# Patient Record
Sex: Male | Born: 1945 | Race: White | Hispanic: No | Marital: Married | Smoking: Former smoker
Health system: Southern US, Community
[De-identification: ages and names within clinical notes are randomized; demographics above are authoritative.]

## PROBLEM LIST (undated history)

## (undated) DIAGNOSIS — Q059 Spina bifida, unspecified: Secondary | ICD-10-CM

## (undated) DIAGNOSIS — E119 Type 2 diabetes mellitus without complications: Secondary | ICD-10-CM

## (undated) DIAGNOSIS — E785 Hyperlipidemia, unspecified: Secondary | ICD-10-CM

## (undated) DIAGNOSIS — Z789 Other specified health status: Secondary | ICD-10-CM

## (undated) HISTORY — DX: Other specified health status: Z78.9

## (undated) HISTORY — DX: Hyperlipidemia, unspecified: E78.5

## (undated) HISTORY — DX: Type 2 diabetes mellitus without complications: E11.9

## (undated) HISTORY — PX: KNEE ARTHROSCOPY: SUR90

## (undated) HISTORY — DX: Spina bifida, unspecified: Q05.9

---

## 2007-09-02 ENCOUNTER — Ambulatory Visit: Payer: Self-pay | Admitting: Surgery

## 2010-12-20 NOTE — Assessment & Plan Note (Signed)
OFFICE VISIT   Grant Richardson, Grant Richardson  DOB:  Jun 18, 1946                                       09/02/2007  EAVWU#:[id number]   PRIMARY ENDOCRINOLOGIST:  Dr. Dalene Seltzer at [facility].   REASON FOR VISIT:  Evaluate left toes.   HISTORY:  This is a 65 year old gentleman who presents with  approximately 3 week history of a bluish-red color change on his third  and fourth left toes.  The patient does have a history of cold exposure  at work, as well as wearing steel-toed shoes.  Of note, he is also a  diabetic.  However, his blood sugars have been well controlled.  He has  recently been started on Plavix for concern over pre-ulcerative lesions  on his skin.  He comes in today for further evaluation.  The patient  states that his left leg chronically is colder than his right.  It is  also slightly smaller.  He has never had episodes like this before.  He  denies having trouble with feeling in his feet, with the exception of  the lateral aspect of his left little toe, and he has not had any motor  function problems.  There has been no open wound.   REVIEW OF SYSTEMS:  GENERAL:  NO fevers or chills.  CARDIAC:  Negative.  PULMONARY:  Negative.  GASTROINTESTINAL:  Negative.  GENITOURINARY:  Negative.  VASCULAR:  Negative.  NEURO:  Negative.  ORTHO:  Negative.  PSYCH:  Negative.  HEMATOLOGICAL:  Negative.  ENT:  Negative.   PAST MEDICAL HISTORY:  Diabetes at age 3.  Spina bifida.   PAST SURGICAL HISTORY:  Right knee scope.   FAMILY HISTORY:  Negative for cardiovascular disease.   SOCIAL HISTORY:  Married.  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  He does not smoke and has never smoked.  He does not  drink alcohol.   MEDICATIONS:  Insulin regular 35 units per day infused by external pump.  Enalapril 5 mg once a day.  Lipitor 10 mg once a day.  Plavix 75 mg once  a day.   ALLERGIES:  None.   PHYSICAL EXAMINATION:  Heart rate 68, blood pressure  130/76.  Generally  well-appearing, in no acute distress.  HEENT is normocephalic,  atraumatic.  Pupils are equal.  Sclerae anicteric.  Neck is supple.  No  JVD.  Cardiovascular is regular.  Pulmonary is respirations are  unlabored.  Abdomen is soft and nontender.  Extremities, the right leg  is larger than the left.  The left foot has a palpable dorsalis pedis,  as well as posterior tibial pulse.  There is a blue/purple hue to the  distal aspect of all 5 toes with areas of hyperpigmentation.  Capillary  refill is sluggish.  Motor and sensory function are preserved.  Cranial  nerves 2-12 grossly intact.  Psych, he is alert and oriented x3.   DIAGNOSTIC STUDIES:  The patient had an arterial duplex today.  This  revealed an ankle brachial index of 1.1 on the left and 1.1 on the  right.  I also had the patient undergo toe pressures, digital wave forms  in the first through fifth foot are all normal.   ASSESSMENT AND PLAN:  Left foot discoloration.   PLAN:  Based on workup today consisting of ultrasound, the patient does  not have ultrasound evidence of arterial disease within his left foot,  despite the fact that his toes look ischemic in nature.  I do not feel  this is related to his arterio vasculature.  At this point, we may need  to entertain possibly being related to cold exposure, or potentially  neurogenic.  I have discussed with the patient that it is possible he  could end up losing one or all of his toes.  However, nothing can be  done at this point in time to improve his circulation.  He understands  this and will follow up on a p.r.n. basis.  Thank you for letting me  take part in the care of this patient.  Please also send a copy of the  lower extremity arterial duplex report to Dr. Dalene Seltzer at [facility].   [name], MD  Electronically Signed   VWB/MEDQ  D:  09/02/2007  T:  09/03/2007  Job:  [id number]   cc:   Denny Peon. Ulice Brilliant, D.P.M.  Dr. Dalene Seltzer

## 2011-05-18 DIAGNOSIS — E785 Hyperlipidemia, unspecified: Secondary | ICD-10-CM | POA: Insufficient documentation

## 2011-10-09 DIAGNOSIS — I10 Essential (primary) hypertension: Secondary | ICD-10-CM | POA: Insufficient documentation

## 2011-10-09 DIAGNOSIS — E109 Type 1 diabetes mellitus without complications: Secondary | ICD-10-CM | POA: Insufficient documentation

## 2011-10-10 DIAGNOSIS — N319 Neuromuscular dysfunction of bladder, unspecified: Secondary | ICD-10-CM | POA: Insufficient documentation

## 2011-10-10 DIAGNOSIS — Q068 Other specified congenital malformations of spinal cord: Secondary | ICD-10-CM | POA: Insufficient documentation

## 2013-07-15 DIAGNOSIS — D1803 Hemangioma of intra-abdominal structures: Secondary | ICD-10-CM | POA: Insufficient documentation

## 2015-01-21 LAB — HM DIABETES EYE EXAM

## 2016-01-27 DIAGNOSIS — M170 Bilateral primary osteoarthritis of knee: Secondary | ICD-10-CM | POA: Insufficient documentation

## 2016-04-06 ENCOUNTER — Encounter: Payer: Self-pay | Admitting: Family Medicine

## 2016-04-06 ENCOUNTER — Ambulatory Visit (INDEPENDENT_AMBULATORY_CARE_PROVIDER_SITE_OTHER): Payer: Medicare Other | Admitting: Family Medicine

## 2016-04-06 VITALS — BP 114/57 | HR 66 | Temp 97.3°F | Ht 68.0 in | Wt 161.0 lb

## 2016-04-06 DIAGNOSIS — I10 Essential (primary) hypertension: Secondary | ICD-10-CM

## 2016-04-06 DIAGNOSIS — M170 Bilateral primary osteoarthritis of knee: Secondary | ICD-10-CM

## 2016-04-06 DIAGNOSIS — E785 Hyperlipidemia, unspecified: Secondary | ICD-10-CM

## 2016-04-06 NOTE — Patient Instructions (Signed)
Great to meet you!  Come back to see [place] in 6 months unless you need [place] sooner.

## 2016-04-06 NOTE — Progress Notes (Signed)
   HPI  Patient presents today to establish care with knee pain  A shunt has severe bilateral knee are see arthritis, last week she's had increasing pain.  On July 22 he had bilateral knee injections and comes in requesting injection of both knees again.  He was seen in urgent care about one week ago and given Naprosyn. He's been having some improvement with Naprosyn and ice, he has not tried compression.  No recent knee injury. He does have a history of knee arthroscopy in 2005.  He's taking his medications as prescribed, he has type 1 diabetes with his recent A1c is 7.2 it is very well controlled. He goes to endocrinology for labs an d follow up in 2 weeks.    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 MSK: BL knee without erythema, effusion, bruising, or gross deformity No joint line tenderness.  ligamentously intact to Lachman's and with varus and valgus stress.  Negative McMurray's test   Assessment and plan:  # BL knee OA Worsening pain, declined injections due to last injections on 7/22, about 1 month ago NSAIDs, Ice, compression F/u with Ortho  # HTN Well controlled on enalapril Labs per endo   # HLD Discussed- he gets labs with endo Good med compliance.   Not he has history of tethered spinal cord now with neurogenic  bladder, gets semi-frequently  T1DM  On a pump per endo, well controlled, Last A1C 7.2   Meds ordered this encounter  Medications  . aspirin 81 MG tablet    Sig: Take by mouth.  . insulin lispro (HUMALOG) 100 UNIT/ML injection    Sig: For use with insulin pump.  Diagnosis: E10.9  . enalapril (VASOTEC) 10 MG tablet    Sig: Take 1 tablet by mouth daily.  Marland Kitchen gabapentin (NEURONTIN) 300 MG capsule    Sig: Take 1 capsule by mouth 3 (three) times daily.  . fluticasone (FLONASE) 50 MCG/ACT nasal spray  . latanoprost (XALATAN) 0.005 % ophthalmic solution    Sig: Place 1 drop into both eyes daily.  . naproxen (NAPROSYN) 500 MG tablet    Sig: Take 1 tablet by mouth 2 (two) times daily.  . simvastatin (ZOCOR) 20 MG tablet    Sig: Take 20 mg by mouth daily.    Laroy Apple, MD [facility] 04/06/2016, 1:40 PM

## 2016-05-08 ENCOUNTER — Ambulatory Visit (INDEPENDENT_AMBULATORY_CARE_PROVIDER_SITE_OTHER): Payer: Medicare Other

## 2016-05-08 ENCOUNTER — Encounter: Payer: Self-pay | Admitting: Family Medicine

## 2016-05-08 ENCOUNTER — Ambulatory Visit (INDEPENDENT_AMBULATORY_CARE_PROVIDER_SITE_OTHER): Payer: Medicare Other | Admitting: Family Medicine

## 2016-05-08 VITALS — BP 113/63 | HR 67 | Temp 96.8°F | Ht 68.0 in | Wt 162.0 lb

## 2016-05-08 DIAGNOSIS — E109 Type 1 diabetes mellitus without complications: Secondary | ICD-10-CM

## 2016-05-08 DIAGNOSIS — Z01818 Encounter for other preprocedural examination: Secondary | ICD-10-CM

## 2016-05-08 DIAGNOSIS — Z23 Encounter for immunization: Secondary | ICD-10-CM | POA: Diagnosis not present

## 2016-05-08 NOTE — Progress Notes (Signed)
HPI  Patient presents today here for preoperative clearance.  Patient has end-stage left-sided knee arthritis and needs knee arthroplasty.  He has well-controlled type 1 diabetes and HTN. He has no history of heart disease.  He feels reasonably good health and has no complaints today.  He can easily walk up a flight of stairs without having shortness of breath or chest pain.  His last A1c was 7.0 checked at his endocrinologist office in September.  PMH: Smoking status noted ROS: Per HPI  Objective: BP 113/63   Pulse 67   Temp (!) 96.8 F (36 C) (Oral)   Ht 5' 8" (1.727 m)   Wt 162 lb (73.5 kg)   BMI 24.63 kg/m  Gen: NAD, alert, cooperative with exam HEENT: NCAT CV: RRR, good S1/S2, no murmur Chest wall: Insulin pump in place Resp: CTABL, no wheezes, non-labored Ext: No edema, warm Neuro: Alert and oriented, No gross deficits  Assessment and plan:  # Preoperative clearance Elevated risk due to type 1 diabetes, however well-controlled Patient is medically stable for surgery, he can easily perform greater than 4 metsof exercise without dyspnea or chest pain EKG today is Chest x-ray is CBC and labs pending  A1c 7.0 on 04/18/2016- from [facility]    Orders Placed This Encounter  Procedures  . DG Chest 2 View    Standing Status:   Future    Standing Expiration Date:   07/08/2017    Order Specific Question:   Reason for Exam (SYMPTOM  OR DIAGNOSIS REQUIRED)    Answer:   pre opp clearance    Order Specific Question:   Preferred imaging location?    Answer:   Internal  . CBC with Differential  . CMP14+EGFR  . LDL Cholesterol, Direct  . EKG 12-Lead    Sam Bradshaw, MD [facility] 05/08/2016, 8:19 AM     

## 2016-05-08 NOTE — Patient Instructions (Signed)
Great to see you!   

## 2016-05-09 LAB — CMP14+EGFR
ALBUMIN: 3.9 g/dL (ref 3.6–4.8)
ALT: 17 IU/L (ref 0–44)
AST: 17 IU/L (ref 0–40)
Albumin/Globulin Ratio: 1.6 (ref 1.2–2.2)
Alkaline Phosphatase: 93 IU/L (ref 39–117)
BUN / CREAT RATIO: 19 (ref 10–24)
BUN: 15 mg/dL (ref 8–27)
Bilirubin Total: 0.4 mg/dL (ref 0.0–1.2)
CO2: 25 mmol/L (ref 18–29)
CREATININE: 0.81 mg/dL (ref 0.76–1.27)
Calcium: 9 mg/dL (ref 8.6–10.2)
Chloride: 97 mmol/L (ref 96–106)
GFR, EST AFRICAN AMERICAN: 105 mL/min/{1.73_m2} (ref >59)
GFR, EST NON AFRICAN AMERICAN: 91 mL/min/{1.73_m2} (ref >59)
GLUCOSE: 209 mg/dL — AB (ref 65–99)
Globulin, Total: 2.5 g/dL (ref 1.5–4.5)
Potassium: 4.2 mmol/L (ref 3.5–5.2)
Sodium: 137 mmol/L (ref 134–144)
TOTAL PROTEIN: 6.4 g/dL (ref 6.0–8.5)

## 2016-05-09 LAB — CBC WITH DIFFERENTIAL/PLATELET
BASOS ABS: 0 10*3/uL (ref 0.0–0.2)
Basos: 1 %
EOS (ABSOLUTE): 0.2 10*3/uL (ref 0.0–0.4)
Eos: 3 %
HEMOGLOBIN: 13.9 g/dL (ref 12.6–17.7)
Hematocrit: 41.3 % (ref 37.5–51.0)
IMMATURE GRANS (ABS): 0 10*3/uL (ref 0.0–0.1)
Immature Granulocytes: 0 %
LYMPHS: 19 %
Lymphocytes Absolute: 1.2 10*3/uL (ref 0.7–3.1)
MCH: 29.6 pg (ref 26.6–33.0)
MCHC: 33.7 g/dL (ref 31.5–35.7)
MCV: 88 fL (ref 79–97)
MONOCYTES: 8 %
Monocytes Absolute: 0.5 10*3/uL (ref 0.1–0.9)
NEUTROS ABS: 4.4 10*3/uL (ref 1.4–7.0)
Neutrophils: 69 %
Platelets: 214 10*3/uL (ref 150–379)
RBC: 4.69 x10E6/uL (ref 4.14–5.80)
RDW: 13.8 % (ref 12.3–15.4)
WBC: 6.3 10*3/uL (ref 3.4–10.8)

## 2016-05-09 LAB — LDL CHOLESTEROL, DIRECT: LDL Direct: 98 mg/dL (ref 0–99)

## 2016-05-18 DIAGNOSIS — M1712 Unilateral primary osteoarthritis, left knee: Secondary | ICD-10-CM | POA: Insufficient documentation

## 2016-05-23 ENCOUNTER — Ambulatory Visit (INDEPENDENT_AMBULATORY_CARE_PROVIDER_SITE_OTHER): Payer: Medicare Other

## 2016-05-23 ENCOUNTER — Encounter: Payer: Self-pay | Admitting: Family Medicine

## 2016-05-23 ENCOUNTER — Ambulatory Visit (INDEPENDENT_AMBULATORY_CARE_PROVIDER_SITE_OTHER): Payer: Medicare Other | Admitting: Family Medicine

## 2016-05-23 VITALS — BP 121/68 | HR 69 | Temp 97.7°F | Ht 68.0 in | Wt 164.6 lb

## 2016-05-23 DIAGNOSIS — M7989 Other specified soft tissue disorders: Secondary | ICD-10-CM

## 2016-05-23 LAB — HEMOGLOBIN A1C: Hemoglobin A1C: 7

## 2016-05-23 NOTE — Progress Notes (Signed)
   HPI  Patient presents today here with right shoulder swelling   Pt states that about 3 days ago he noticed some very mild bilateral shoulder pain, he thought this may be due to the way he slept. The following day he noticed a swollen area on top of his shoulder.   He has mild pain with reaching up and but no limitations and states the pain is not bad enough to take medicine for.   He denies any injury, fever, or severe stiff joints in the early am.   No Hx of gout or inflammatory arthritis  PMH: Smoking status noted ROS: Per HPI  Objective: BP 121/68   Pulse 69   Temp 97.7 F (36.5 C) (Oral)   Ht 5\' 8"  (1.727 m)   Wt 164 lb 9.6 oz (74.7 kg)   BMI 25.03 kg/m  Gen: NAD, alert, cooperative with exam HEENT: NCAT CV: RRR, good S1/S2, no murmur Resp: CTABL, no wheezes, non-labored Ext: No edema, warm Neuro: Alert and oriented, No gross deficits  MSK: R AC joint swollen and red, non tender Full ROM of shoulder, no TTP  Negative hawkins and empty can  Plain film with erosion around the [facility] joint, no Fx or acute findings  Assessment and plan:  # R shoulder swelling With x ray findings I am at least considering inflammatory arthritis. He does have Hx of T1Dm. Some erosion and mottling at the Ac joint Awaiting radiology read prior to any additional blood work, pain is mild today and pt does not want Tx at this time.     Orders Placed This Encounter  Procedures  . DG Shoulder Right    Standing Status:   Future    Standing Expiration Date:   07/23/2017    Order Specific Question:   Reason for Exam (SYMPTOM  OR DIAGNOSIS REQUIRED)    Answer:   swelling at R [facility] joint    Order Specific Question:   Preferred imaging location?    Answer:   External     Laroy Apple, MD [facility] 05/23/2016, 3:02 PM

## 2016-06-09 DIAGNOSIS — N390 Urinary tract infection, site not specified: Secondary | ICD-10-CM | POA: Insufficient documentation

## 2016-11-21 DIAGNOSIS — L89322 Pressure ulcer of left buttock, stage 2: Secondary | ICD-10-CM | POA: Insufficient documentation

## 2016-11-21 DIAGNOSIS — Q059 Spina bifida, unspecified: Secondary | ICD-10-CM | POA: Insufficient documentation

## 2016-12-15 ENCOUNTER — Encounter: Payer: Self-pay | Admitting: Pharmacist

## 2017-06-18 ENCOUNTER — Other Ambulatory Visit: Payer: Self-pay | Admitting: Physician Assistant

## 2017-06-18 NOTE — Telephone Encounter (Signed)
Last seen 05/23/17  Dr Wendi Snipes

## 2017-07-10 ENCOUNTER — Ambulatory Visit (INDEPENDENT_AMBULATORY_CARE_PROVIDER_SITE_OTHER): Payer: Medicare Other | Admitting: Family Medicine

## 2017-07-10 ENCOUNTER — Encounter: Payer: Self-pay | Admitting: Family Medicine

## 2017-07-10 VITALS — BP 125/67 | HR 72 | Temp 97.1°F | Ht 68.0 in | Wt 161.4 lb

## 2017-07-10 DIAGNOSIS — J0101 Acute recurrent maxillary sinusitis: Secondary | ICD-10-CM | POA: Diagnosis not present

## 2017-07-10 MED ORDER — DOXYCYCLINE HYCLATE 100 MG PO TABS: 100.0000 mg | ORAL_TABLET | Freq: Two times a day (BID) | ORAL | 0 refills | Status: DC

## 2017-07-10 NOTE — Progress Notes (Signed)
   HPI  Patient presents today here with concern for sinus infection.  Patient reports 2-3 weeks of dull frontal headache, chills, decreased energy, intermittent elevated temperature about 1 degree above his norm, sinus congestion, right-sided maxillary sinus tenderness and pressure.  He denies shortness of breath, difficulty tolerating foods or fluids.  PMH: Smoking status noted ROS: Per HPI  Objective: BP 125/67   Pulse 72   Temp (!) 97.1 F (36.2 C) (Oral)   Ht 5\' 8"  (1.727 m)   Wt 161 lb 6.4 oz (73.2 kg)   BMI 24.54 kg/m  Gen: NAD, alert, cooperative with exam HEENT: NCAT, and is to palpation of the right maxillary sinus, oropharynx moist and clear, nares with swollen turbinates bilaterally CV: RRR, good S1/S2, no murmur Resp: CTABL, no wheezes, non-labored Ext: No edema, warm Neuro: Alert and oriented, No gross deficits  Assessment and plan:  #Acute maxillary sinusitis Treat with doxycycline, patient recently finished a course of Keflex for a tooth problem which could lead to some possible cephalosporin resistance. Call or return to clinic with any concerns    Meds ordered this encounter  Medications  . doxycycline (VIBRA-TABS) 100 MG tablet    Sig: Take 1 tablet (100 mg total) by mouth 2 (two) times daily. 1 po bid    Dispense:  20 tablet    Refill:  0    Laroy Apple, MD [facility] 07/10/2017, 1:55 PM

## 2017-07-10 NOTE — Patient Instructions (Signed)
Great to see you!   Sinusitis, Adult Sinusitis is soreness and inflammation of your sinuses. Sinuses are hollow spaces in the bones around your face. They are located:  Around your eyes.  In the middle of your forehead.  Behind your nose.  In your cheekbones.  Your sinuses and nasal passages are lined with a stringy fluid (mucus). Mucus normally drains out of your sinuses. When your nasal tissues get inflamed or swollen, the mucus can get trapped or blocked so air cannot flow through your sinuses. This lets bacteria, viruses, and funguses grow, and that leads to infection. Follow these instructions at home: Medicines  Take, use, or apply over-the-counter and prescription medicines only as told by your doctor. These may include nasal sprays.  If you were prescribed an antibiotic medicine, take it as told by your doctor. Do not stop taking the antibiotic even if you start to feel better. Hydrate and Humidify  Drink enough water to keep your pee (urine) clear or pale yellow.  Use a cool mist humidifier to keep the humidity level in your home above 50%.  Breathe in steam for 10-15 minutes, 3-4 times a day or as told by your doctor. You can do this in the bathroom while a hot shower is running.  Try not to spend time in cool or dry air. Rest  Rest as much as possible.  Sleep with your head raised (elevated).  Make sure to get enough sleep each night. General instructions  Put a warm, moist washcloth on your face 3-4 times a day or as told by your doctor. This will help with discomfort.  Wash your hands often with soap and water. If there is no soap and water, use hand sanitizer.  Do not smoke. Avoid being around people who are smoking (secondhand smoke).  Keep all follow-up visits as told by your doctor. This is important. Contact a doctor if:  You have a fever.  Your symptoms get worse.  Your symptoms do not get better within 10 days. Get help right away if:  You  have a very bad headache.  You cannot stop throwing up (vomiting).  You have pain or swelling around your face or eyes.  You have trouble seeing.  You feel confused.  Your neck is stiff.  You have trouble breathing. This information is not intended to replace advice given to you by your health care provider. Make sure you discuss any questions you have with your health care provider. Document Released: 01/10/2008 Document Revised: 03/19/2016 Document Reviewed: 05/19/2015 Elsevier Interactive Patient Education  2018 Elsevier Inc.  

## 2017-07-29 IMAGING — DX DG SHOULDER 2+V*R*
4 series · 4 of 4 positions shown · non-contrast
Comparison: Chest radiograph 05/08/2016

CLINICAL DATA: Right AC joint swelling for 2 days.

EXAM:
RIGHT SHOULDER - 2+ VIEW

[shoulder ap (1 of 2)]
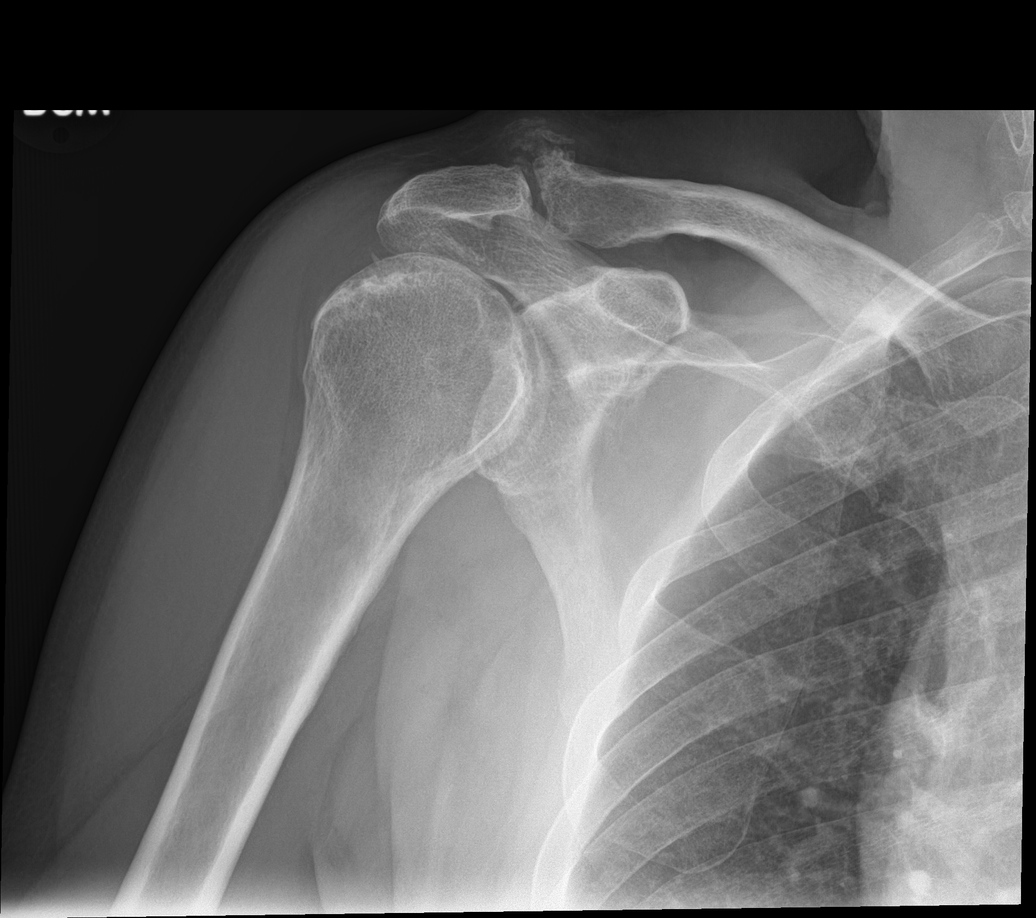

[shoulder obl]
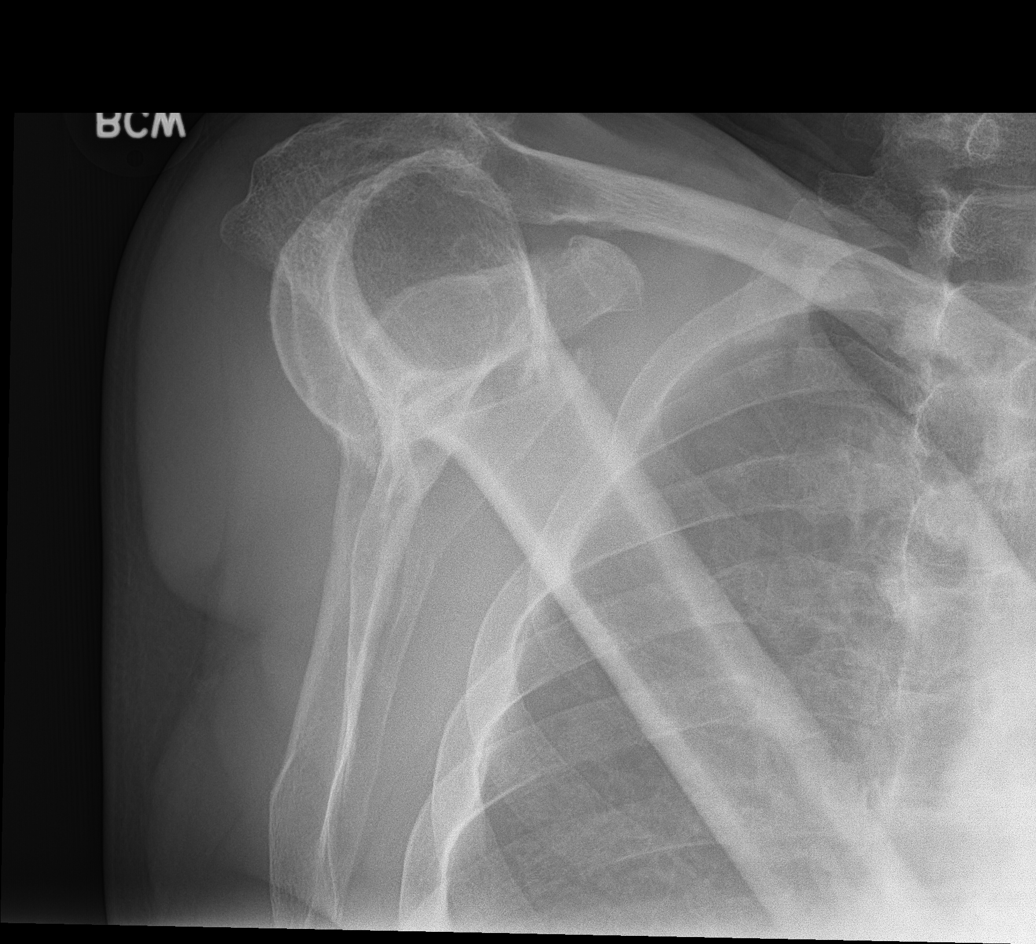

[shoulder axial]
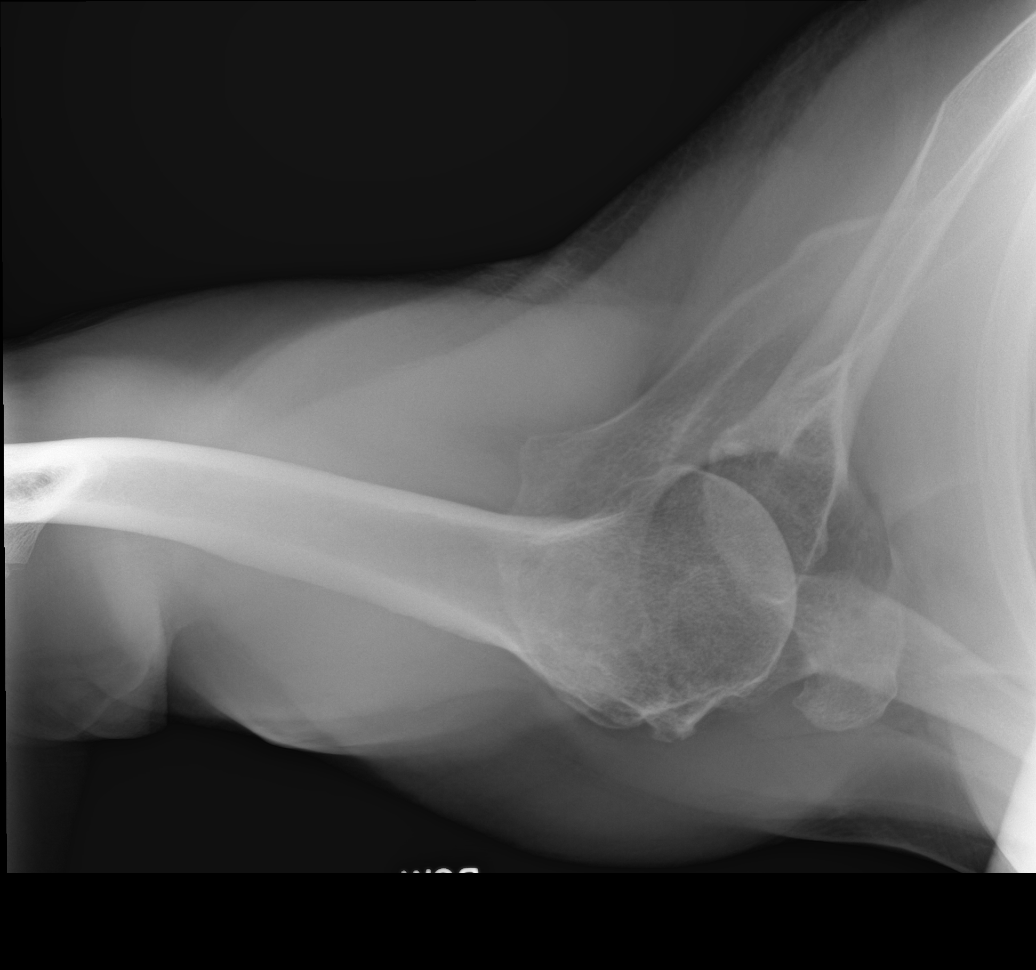

[shoulder ap (2 of 2)]
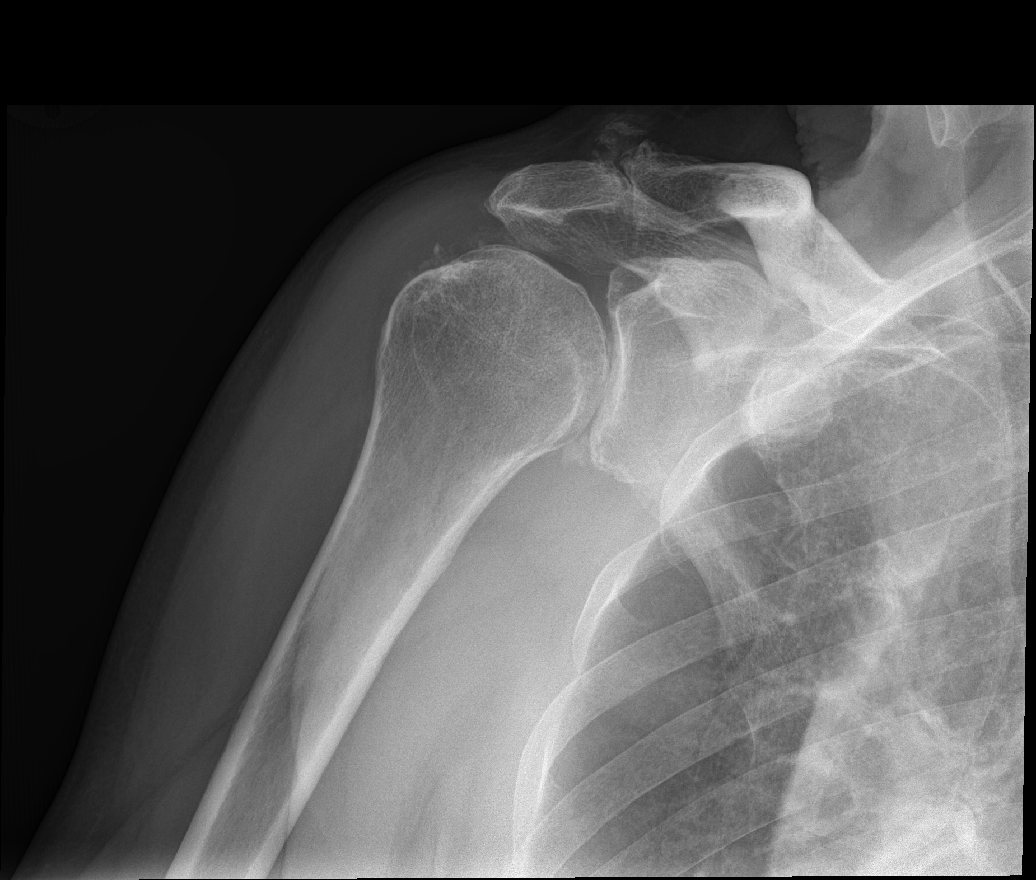

[4 of 4 positions shown; findings below may reference images not displayed]

FINDINGS: Prominent degenerative changes at the right AC joint with
heterotopic ossifications. Normal alignment of the right AC joint.
Irregularity of the right glenohumeral joint with densities along
the inferior aspect of the right glenohumeral joint. In addition,
there are calcification in the rotator cuff. Right shoulder is
located without a fracture.
IMPRESSION: Prominent degenerative changes at the right AC joint.

Calcific tendinitis in the right shoulder.

Degenerative changes and calcifications involving the right
glenohumeral joint.

No acute bone abnormality.

## 2017-08-27 ENCOUNTER — Ambulatory Visit (INDEPENDENT_AMBULATORY_CARE_PROVIDER_SITE_OTHER): Payer: Medicare Other

## 2017-08-27 ENCOUNTER — Ambulatory Visit (INDEPENDENT_AMBULATORY_CARE_PROVIDER_SITE_OTHER): Payer: Medicare Other | Admitting: Nurse Practitioner

## 2017-08-27 ENCOUNTER — Encounter: Payer: Self-pay | Admitting: Nurse Practitioner

## 2017-08-27 VITALS — BP 123/65 | HR 66 | Temp 96.8°F | Ht 68.0 in | Wt 161.0 lb

## 2017-08-27 DIAGNOSIS — R202 Paresthesia of skin: Secondary | ICD-10-CM

## 2017-08-27 DIAGNOSIS — R195 Other fecal abnormalities: Secondary | ICD-10-CM

## 2017-08-27 DIAGNOSIS — J0110 Acute frontal sinusitis, unspecified: Secondary | ICD-10-CM | POA: Diagnosis not present

## 2017-08-27 DIAGNOSIS — K5900 Constipation, unspecified: Secondary | ICD-10-CM | POA: Diagnosis not present

## 2017-08-27 DIAGNOSIS — R200 Anesthesia of skin: Secondary | ICD-10-CM

## 2017-08-27 MED ORDER — AMOXICILLIN-POT CLAVULANATE 875-125 MG PO TABS: 1.0000 | ORAL_TABLET | Freq: Two times a day (BID) | ORAL | 0 refills | Status: DC

## 2017-08-27 NOTE — Patient Instructions (Signed)

## 2017-08-27 NOTE — Progress Notes (Signed)
Subjective:    Patient ID: Grant Richardson., male    DOB: 10-07-1945, 72 y.o.   MRN: [id number]  HPI Patient comes in today with several issues: - sinus congestion with headache. Started late Friday evening. Low garde fever at 99.5. Has tried tylenol severs sinus which has not helped.  - hard dry stool- started off and on for 6-8 weeks. He as tried OTC meds and has not helped. (benefiber, citracal and seneokot. - trouble using left thumb. Has numbness in thumb indea and ring finger on left hand.  Review of Systems  Constitutional: Positive for fever (low grade). Negative for chills.  HENT: Positive for congestion, postnasal drip, rhinorrhea and sinus pressure. Negative for ear pain, sore throat and trouble swallowing.   Respiratory: Positive for cough (slight).   Cardiovascular: Negative.   Neurological: Positive for headaches (3/10 across forehead).  Psychiatric/Behavioral: Negative.   All other systems reviewed and are negative.      Objective:   Physical Exam  Constitutional: He is oriented to person, place, and time. He appears well-developed and well-nourished. No distress.  HENT:  Right Ear: Hearing, tympanic membrane, external ear and ear canal normal.  Left Ear: Hearing, tympanic membrane, external ear and ear canal normal.  Nose: Mucosal edema and rhinorrhea present. Right sinus exhibits frontal sinus tenderness. Right sinus exhibits no maxillary sinus tenderness. Left sinus exhibits frontal sinus tenderness. Left sinus exhibits no maxillary sinus tenderness.  Mouth/Throat: Uvula is midline, oropharynx is clear and moist and mucous membranes are normal.  Neck: Neck supple.  Cardiovascular: Normal rate and regular rhythm.  Pulmonary/Chest: Effort normal and breath sounds normal.  Abdominal: Soft. Bowel sounds are normal. He exhibits no distension. There is no tenderness.  Musculoskeletal:  Numbness tps of thumb, index and ring finger on left  (+) phalen test on left    Lymphadenopathy:    He has no cervical adenopathy.  Neurological: He is oriented to person, place, and time. He has normal reflexes. No cranial nerve deficit.  Skin: Skin is warm.  Psychiatric: He has a normal mood and affect. His behavior is normal. Judgment and thought content normal.    BP 123/65   Pulse 66   Temp (!) 96.8 F (36 C) (Oral)   Ht 5\' 8"  (1.727 m)   Wt 161 lb (73 kg)   BMI 24.48 kg/m   kub- constipation-Preliminary reading by Ronnald Collum, FNP  [facility]      Assessment & Plan:  1. Hard stool - DG Abd 1 View; Future  2. Constipation, unspecified constipation type Dose of milk of magnesia and 6oz of prune juice today mirlax daily in apple juice Increase fiber in diet Force fluids  3. Numbness and tingling in left hand Will wait on nerve conduction study - Nerve conduction test; Future  4. Acute frontal sinusitis, recurrence not specified 1. Take meds as prescribed 2. Use a cool mist humidifier especially during the winter months and when heat has been humid. 3. Use saline nose sprays frequently 4. Saline irrigations of the nose can be very helpful if done frequently.  * 4X daily for 1 week*  * Use of a nettie pot can be helpful with this. Follow directions with this* 5. Drink plenty of fluids 6. Keep thermostat turn down low 7.For any cough or congestion  Use plain Mucinex- regular strength or max strength is fine   * Children- consult with Pharmacist for dosing 8. For fever or aces or pains- take tylenol or ibuprofen  appropriate for age and weight.  * for fevers greater than 101 orally you may alternate ibuprofen and tylenol every  3 hours.    - amoxicillin-clavulanate (AUGMENTIN) 875-125 MG tablet; Take 1 tablet by mouth 2 (two) times daily.  Dispense: 20 tablet; Refill: 0  Mary-Margaret Hassell Done, FNP

## 2017-09-13 ENCOUNTER — Encounter: Payer: Self-pay | Admitting: Family Medicine

## 2017-09-13 ENCOUNTER — Ambulatory Visit (INDEPENDENT_AMBULATORY_CARE_PROVIDER_SITE_OTHER): Payer: Medicare Other | Admitting: Family Medicine

## 2017-09-13 VITALS — BP 97/53 | HR 77 | Temp 96.5°F | Ht 68.0 in | Wt 158.8 lb

## 2017-09-13 DIAGNOSIS — M25861 Other specified joint disorders, right knee: Secondary | ICD-10-CM | POA: Diagnosis not present

## 2017-09-13 DIAGNOSIS — M2042 Other hammer toe(s) (acquired), left foot: Secondary | ICD-10-CM

## 2017-09-13 DIAGNOSIS — G5602 Carpal tunnel syndrome, left upper limb: Secondary | ICD-10-CM | POA: Diagnosis not present

## 2017-09-13 NOTE — Patient Instructions (Signed)
Great to see you!  Come back to see [place] in 6 months or so. I think you would like Dr. Warrick Parisian when I am gone.

## 2017-09-13 NOTE — Progress Notes (Signed)
   HPI  Patient presents today here for follow-up and to discuss chronic medical conditions.  He has diabetes, this is followed at [facility].  Patient complains of a knot on his right knee.  Nonpainful, no injury. Is not changing. Started about 12 weeks ago.  Patient also complains of approximately 10 weeks of left hand tingling and loss of strength.  He states that it is in his first through third fingers. He did hit the top of his hand but it is worse at night. He states that he has difficult time making a C with his left hand now.  He also complains of hammertoes on the left foot. When his A1c is better controlled he would like to see orthopedics at [facility] in [place] [facility]  PMH: Smoking status noted ROS: Per HPI  Objective: BP (!) 97/53   Pulse 77   Temp (!) 96.5 F (35.8 C) (Oral)   Ht 5\' 8"  (1.727 m)   Wt 158 lb 12.8 oz (72 kg)   BMI 24.15 kg/m  Gen: NAD, alert, cooperative with exam HEENT: NCAT CV: RRR, good S1/S2, no murmur Resp: CTABL, no wheezes, non-labored Ext: No edema, warm Neuro: Alert and oriented, No gross deficits Right knee with fluctuant tight subcutaneous mass consistent with cyst, nontender to palpation, just below right lateral joint line.  Left second through fourth toes with mild hammertoes.  Left wrist with positive Tinel sign in the left thumb, describes numbness and tingling in first through third fingers  Assessment and plan:  #Carpal tunnel syndrome, Left Starting cock-up splint  #Hammertoe the left foot Hammertoes x3, refer to orthopedics when he is ready  #Cyst of the right knee I believe this is a cyst, I recommended discussing this with his orthopedic surgeon. It is nonpainful and not enlarging    Laroy Apple, MD [facility] 09/13/2017, 8:25 AM

## 2017-11-08 ENCOUNTER — Ambulatory Visit (INDEPENDENT_AMBULATORY_CARE_PROVIDER_SITE_OTHER): Payer: Medicare Other | Admitting: Family Medicine

## 2017-11-08 ENCOUNTER — Encounter: Payer: Self-pay | Admitting: Family Medicine

## 2017-11-08 VITALS — BP 130/72 | HR 96 | Temp 97.6°F | Ht 68.0 in | Wt 156.4 lb

## 2017-11-08 DIAGNOSIS — K5900 Constipation, unspecified: Secondary | ICD-10-CM

## 2017-11-08 DIAGNOSIS — J0110 Acute frontal sinusitis, unspecified: Secondary | ICD-10-CM

## 2017-11-08 MED ORDER — AMOXICILLIN-POT CLAVULANATE 875-125 MG PO TABS: 1.0000 | ORAL_TABLET | Freq: Two times a day (BID) | ORAL | 0 refills | Status: DC

## 2017-11-08 NOTE — Patient Instructions (Signed)
Great to see you!  Consider Metamucil 1 scoop once a day for 3-4 days, you can take this long term without any problem as well.    Sinusitis, Adult Sinusitis is soreness and inflammation of your sinuses. Sinuses are hollow spaces in the bones around your face. They are located:  Around your eyes.  In the middle of your forehead.  Behind your nose.  In your cheekbones.  Your sinuses and nasal passages are lined with a stringy fluid (mucus). Mucus normally drains out of your sinuses. When your nasal tissues get inflamed or swollen, the mucus can get trapped or blocked so air cannot flow through your sinuses. This lets bacteria, viruses, and funguses grow, and that leads to infection. Follow these instructions at home: Medicines  Take, use, or apply over-the-counter and prescription medicines only as told by your doctor. These may include nasal sprays.  If you were prescribed an antibiotic medicine, take it as told by your doctor. Do not stop taking the antibiotic even if you start to feel better. Hydrate and Humidify  Drink enough water to keep your pee (urine) clear or pale yellow.  Use a cool mist humidifier to keep the humidity level in your home above 50%.  Breathe in steam for 10-15 minutes, 3-4 times a day or as told by your doctor. You can do this in the bathroom while a hot shower is running.  Try not to spend time in cool or dry air. Rest  Rest as much as possible.  Sleep with your head raised (elevated).  Make sure to get enough sleep each night. General instructions  Put a warm, moist washcloth on your face 3-4 times a day or as told by your doctor. This will help with discomfort.  Wash your hands often with soap and water. If there is no soap and water, use hand sanitizer.  Do not smoke. Avoid being around people who are smoking (secondhand smoke).  Keep all follow-up visits as told by your doctor. This is important. Contact a doctor if:  You have a  fever.  Your symptoms get worse.  Your symptoms do not get better within 10 days. Get help right away if:  You have a very bad headache.  You cannot stop throwing up (vomiting).  You have pain or swelling around your face or eyes.  You have trouble seeing.  You feel confused.  Your neck is stiff.  You have trouble breathing. This information is not intended to replace advice given to you by your health care provider. Make sure you discuss any questions you have with your health care provider. Document Released: 01/10/2008 Document Revised: 03/19/2016 Document Reviewed: 05/19/2015 Elsevier Interactive Patient Education  [employer].

## 2017-11-08 NOTE — Progress Notes (Signed)
   HPI  Patient presents today here with ear pain and headaches.  Patient explains that he has had mild dull persistent frontal headache and intermittent right ear pain off and on for about 2 or 3 weeks.  He is also had some sensation of hearing his heartbeat in his ear, this happened on his left ear as well for a time but then resolved.  Patient states that his been using peroxide drops in the ear, also some decongestants and nasal saline rinses.  Patient also complains of constipation He had what he describes as dry hard stools for a few weeks, now he has not had a bowel movement in 2-1/2 days and is getting slight bilateral lower abdominal discomfort. He is tried Colace without much improvement. He drinks adequate fluids. He understands this may be medication side effect from trying to do with his nasal congestion.  PMH: Smoking status noted ROS: Per HPI  Objective: BP 130/72   Pulse 96   Temp 97.6 F (36.4 C) (Oral)   Ht 5\' 8"  (1.727 m)   Wt 156 lb 6.4 oz (70.9 kg)   BMI 23.78 kg/m  Gen: NAD, alert, cooperative with exam HEENT: NCAT, no tenderness over the bilateral mastoids, frontal or maxillary sinuses, TMs are normal bilaterally with clear effusion on the right CV: RRR, good S1/S2, no murmur Resp: CTABL, no wheezes, non-labored Ext: No edema, warm Neuro: Alert and oriented, No gross deficits  Assessment and plan:  #Frontal sinusitis Likely acute or subacute frontal sinusitis Treat with Augmentin He is likely sensing eustachian tube dysfunction and ear effusion, he does have a mild effusion today, Augmentin will likely help his congestion as well and relieve the pressure.  #Constipation Mild Discussed Metamucil Also discussed Ex-Lax    Meds ordered this encounter  Medications  . amoxicillin-clavulanate (AUGMENTIN) 875-125 MG tablet    Sig: Take 1 tablet by mouth 2 (two) times daily.    Dispense:  20 tablet    Refill:  0    Laroy Apple, MD [facility] 11/08/2017, 3:36 PM

## 2018-03-26 ENCOUNTER — Encounter: Payer: Self-pay | Admitting: Physician Assistant

## 2018-03-26 ENCOUNTER — Ambulatory Visit (INDEPENDENT_AMBULATORY_CARE_PROVIDER_SITE_OTHER): Payer: Medicare Other | Admitting: Physician Assistant

## 2018-03-26 VITALS — BP 114/67 | HR 70 | Temp 97.3°F | Ht 68.0 in | Wt 154.2 lb

## 2018-03-26 DIAGNOSIS — J0110 Acute frontal sinusitis, unspecified: Secondary | ICD-10-CM | POA: Diagnosis not present

## 2018-03-26 MED ORDER — AMOXICILLIN-POT CLAVULANATE 875-125 MG PO TABS: 1.0000 | ORAL_TABLET | Freq: Two times a day (BID) | ORAL | 0 refills | Status: DC

## 2018-03-27 NOTE — Progress Notes (Signed)
BP 114/67   Pulse 70   Temp (!) 97.3 F (36.3 C) (Oral)   Ht 5\' 8"  (1.727 m)   Wt 154 lb 3.2 oz (69.9 kg)   BMI 23.45 kg/m    Subjective:    Patient ID: Grant Richardson., male    DOB: [birth year], 72 y.o.   MRN: [id number]  HPI: Grant Richardson. is a 72 y.o. male presenting on 03/26/2018 for Sinusitis; Dull headache; and Fullness in ears  This patient has had many days of sinus headache and postnasal drainage. There is copious drainage at times. Denies any fever at this time. There has been a history of sinus infections in the past.  No history of sinus surgery. There is cough at night. It has become more prevalent in recent days.   Past Medical History:  Diagnosis Date  . Diabetes mellitus without complication ([place])    Relevant past medical, surgical, family and social history reviewed and updated as indicated. Interim medical history since our last visit reviewed. Allergies and medications reviewed and updated. DATA REVIEWED: CHART IN EPIC  Family History reviewed for pertinent findings.  Review of Systems  Constitutional: Positive for fatigue. Negative for appetite change and fever.  HENT: Positive for congestion, sinus pressure, sinus pain and sore throat.   Eyes: Negative.  Negative for pain and visual disturbance.  Respiratory: Positive for shortness of breath. Negative for cough, chest tightness and wheezing.   Cardiovascular: Negative.  Negative for chest pain, palpitations and leg swelling.  Gastrointestinal: Negative.  Negative for abdominal pain, diarrhea, nausea and vomiting.  Endocrine: Negative.   Genitourinary: Negative.   Musculoskeletal: Negative for back pain and myalgias.  Skin: Negative.  Negative for color change and rash.  Neurological: Positive for headaches. Negative for weakness and numbness.  Psychiatric/Behavioral: Negative.     Allergies as of 03/26/2018   No Known Allergies     Medication List        Accurate as of 03/26/18 11:59 PM.  Always use your most recent med list.          amoxicillin-clavulanate 875-125 MG tablet Commonly known as:  AUGMENTIN Take 1 tablet by mouth 2 (two) times daily.   aspirin 81 MG tablet Take by mouth.   atorvastatin 10 MG tablet Commonly known as:  LIPITOR Take 1 tablet by mouth daily.   enalapril 10 MG tablet Commonly known as:  VASOTEC TAKE 1 TABLET ONCE DAILY.   fluticasone 50 MCG/ACT nasal spray Commonly known as:  FLONASE   gabapentin 300 MG capsule Commonly known as:  NEURONTIN Take 1 capsule by mouth 3 (three) times daily.   insulin lispro 100 UNIT/ML injection Commonly known as:  HUMALOG For use with insulin pump.  Diagnosis: E10.9   latanoprost 0.005 % ophthalmic solution Commonly known as:  XALATAN Place 1 drop into both eyes daily.          Objective:    BP 114/67   Pulse 70   Temp (!) 97.3 F (36.3 C) (Oral)   Ht 5\' 8"  (1.727 m)   Wt 154 lb 3.2 oz (69.9 kg)   BMI 23.45 kg/m   No Known Allergies  Wt Readings from Last 3 Encounters:  03/26/18 154 lb 3.2 oz (69.9 kg)  11/08/17 156 lb 6.4 oz (70.9 kg)  09/13/17 158 lb 12.8 oz (72 kg)    Physical Exam  Constitutional: He is oriented to person, place, and time. He appears well-developed and well-nourished.  HENT:  Head: Normocephalic  and atraumatic.  Right Ear: Tympanic membrane and external ear normal. No middle ear effusion.  Left Ear: Tympanic membrane and external ear normal.  No middle ear effusion.  Nose: Mucosal edema and rhinorrhea present. Right sinus exhibits no maxillary sinus tenderness. Left sinus exhibits no maxillary sinus tenderness.  Mouth/Throat: Uvula is midline. Posterior oropharyngeal erythema present.  Eyes: Pupils are equal, round, and reactive to light. Conjunctivae and EOM are normal. Right eye exhibits no discharge. Left eye exhibits no discharge.  Neck: Normal range of motion.  Cardiovascular: Normal rate, regular rhythm and normal heart sounds.  Pulmonary/Chest:  Effort normal and breath sounds normal. No respiratory distress. He has no wheezes.  Abdominal: Soft.  Lymphadenopathy:    He has no cervical adenopathy.  Neurological: He is alert and oriented to person, place, and time.  Skin: Skin is warm and dry.  Psychiatric: He has a normal mood and affect.    Results for orders placed or performed in visit on 12/15/16  Hemoglobin A1c  Result Value Ref Range   Hemoglobin A1C 7.0       Assessment & Plan:   1. Acute non-recurrent frontal sinusitis - amoxicillin-clavulanate (AUGMENTIN) 875-125 MG tablet; Take 1 tablet by mouth 2 (two) times daily.  Dispense: 20 tablet; Refill: 0   Continue all other maintenance medications as listed above.  Follow up plan: No follow-ups on file.  Educational handout given for sinusitis  Terald Sleeper PA-C [facility] [address]  [place], [place] [address] [phone]   03/27/2018, 10:37 PM'

## 2018-04-26 ENCOUNTER — Encounter: Payer: Self-pay | Admitting: Pediatrics

## 2018-04-26 ENCOUNTER — Ambulatory Visit (INDEPENDENT_AMBULATORY_CARE_PROVIDER_SITE_OTHER): Payer: Medicare Other | Admitting: Pediatrics

## 2018-04-26 VITALS — BP 120/67 | HR 68 | Temp 97.1°F | Resp 18 | Ht 68.0 in | Wt 152.6 lb

## 2018-04-26 DIAGNOSIS — J069 Acute upper respiratory infection, unspecified: Secondary | ICD-10-CM | POA: Diagnosis not present

## 2018-04-26 NOTE — Patient Instructions (Signed)
Fever reducer and headache: tylenol and ibuprofen (400mg  2-3 times a day), can take together or alternating   Sinus pressure:  Nasal steroid such as flonase/fluticaone or nasocort daily Can also take daily antihistamine such as loratadine/claritin or cetirizine/zyrtec  Sinus rinses/irritation: Netipot or similar with distilled water 2-3 times a day to clear out sinuses or Normal saline nasal spray  Sore throat:  Throat lozenges chloroseptic spray  Stick with bland foods Drink lots of fluids

## 2018-04-26 NOTE — Progress Notes (Signed)
  Subjective:   Patient ID: Grant Richardson., male    DOB: [birth year], 72 y.o.   MRN: [id number] CC: Headache; Facial Pain; Cough; and Chest Congestion  HPI: Grant Richardson. is a 72 y.o. male   Ear fullness L>R, dull frontal headache headache off and on that improves some with Excedrin, better than with Tylenol, hacking cough off and on during the day, not keeping him up at night.  Symptoms ongoing for the 2-3 days.  No fevers.  Appetite has been normal.  Slightly more tired than usual. Taking flonase, zyrtec.  Blood sugars have been elevated past couple days, has had to take more insulin than usual.  Follows with endocrinology regularly getting labs through them.  Most recent creatinine 0.9 in 02/2018.  Relevant past medical, surgical, family and social history reviewed. Allergies and medications reviewed and updated. Social History   Tobacco Use  Smoking Status Former Smoker  Smokeless Tobacco Former Systems developer   ROS: Per HPI   Objective:    BP 120/67   Pulse 68   Temp (!) 97.1 F (36.2 C) (Oral)   Resp 18   Ht 5\' 8"  (1.727 m)   Wt 152 lb 9.6 oz (69.2 kg)   SpO2 99%   BMI 23.20 kg/m   Wt Readings from Last 3 Encounters:  04/26/18 152 lb 9.6 oz (69.2 kg)  03/26/18 154 lb 3.2 oz (69.9 kg)  11/08/17 156 lb 6.4 oz (70.9 kg)    Gen: NAD, alert, cooperative with exam, NCAT EYES: EOMI, no conjunctival injection, or no icterus ENT: Left TM obscured by cerumen, right TM gray with normal light reflex, small layering yellow effusion, OP without erythema LYMPH: no cervical LAD CV: NRRR, normal S1/S2, no murmur, distal pulses 2+ b/l Resp: CTABL, no wheezes, normal WOB Abd: +BS, soft, NTND. no guarding or organomegaly Ext: No edema, warm Neuro: Alert and oriented MSK: normal muscle bulk  Assessment & Plan:  Grant Richardson was seen today for headache, facial pain, cough and chest congestion.  Diagnoses and all orders for this visit:  Acute URI Symptomatic care and return precautions  discussed.  Follow up plan: Return if symptoms worsen or fail to improve. Grant Found, MD [facility]

## 2018-05-21 DIAGNOSIS — Z23 Encounter for immunization: Secondary | ICD-10-CM

## 2018-07-08 ENCOUNTER — Telehealth: Payer: Self-pay | Admitting: *Deleted

## 2018-07-08 DIAGNOSIS — R918 Other nonspecific abnormal finding of lung field: Secondary | ICD-10-CM

## 2018-07-08 NOTE — Telephone Encounter (Signed)
Patient was seen by urology and had a ct scan. They found a lung nodule and recommend ct chest. I have copied results from care everywhere. Please advise  1. Left upper pole 4.9 cm benign simple renal cyst. 2. Liver hemangiomas and scattered subcentimeter hypoattenuating liver lesions that likely represent benign liver cysts. 3. Mild circumferential thickening of the urinary bladder wall, consistent with chronic changes related to patient's known history of neurogenic bladder. 4. Bilateral lower lobe pulmonary nodules with maximal dimension of 5 mm. Recommend nonemergent chest CT for further evaluation.  Result Narrative  CT OF THE ABDOMEN AND PELVIS WITHOUT AND WITH INTRAVENOUS CONTRAST (UROGRAPHY), 06/07/2018 1:27 PM   INDICATION: complex cyst \ N28.1 Complex renal cyst   ADDITIONAL HISTORY: History of neurogenic bladder.  COMPARISON: Renal ultrasound 05/27/2018   TECHNIQUE: Axial images of the abdomen and pelvis were obtained without intravenous contrast. After the administration of intravenous contrast, axial images of the abdomen and pelvis were obtained in the nephrographic and excretory phases. Supplemental 2D reformatted images were generated and reviewed as needed.  All CT scans at [facility] and [place] are performed using dose optimization techniques as appropriate to a performed exam, including but not limited to one or more of the following: automated exposure control, adjustment of the mA and/or kV according to patient size, use of iterative reconstruction technique. In addition, [facility] is participating in the [place] program which will further assist [place] in optimizing patient radiation exposure.  FINDINGS:  LOWER CHEST Heart/vessels: Borderline enlarged heart size. Coronary artery calcifications. Pleura: Within normal limits. Lungs: Bibasilar atelectasis. Right lower lung 5 mm pleural-based nodule (series 5, image 42). Two  left lower lobe nodules measuring 5 mm and 3 mm (series 5, image 6 and 11).  ABDOMEN Liver: Normal size and contour. Three hypoattenuating liver lesions, the largest in segment 4A measuring 3.3 x 2.5 cm with peripheral, nodular and discontinuous enhancement and fill in with delayed imaging, consistent with liver hemangiomas. There are also scattered subcentimeter hypoattenuating lesions favored to represent benign cysts. Gallbladder/biliary: Within normal limits. Spleen: Normal size and contour. There is a 1.2 cm splenic hemangioma. Pancreas: Within normal limits. Adrenals: Within normal limits. Kidneys: Uniform cystic left upper pole renal lesion measuring 3.9 x 3.5 x 4.7 cm with internal fluid attenuation, imperceptible walls, and no enhancement with contrast. No internal septations or solid components. No hydronephrosis. Peritoneum/mesenteries: Within normal limits. Extraperitoneum: Within normal limits. Gastrointestinal tract: Moderate volume of stool throughout the colon. No obstruction. Normal decompressed appearance of the appendix (series 4, image 86). Vascular: Scattered atherosclerotic calcification of the abdominal aorta. Moderate atherosclerotic calcification of the common iliac arteries. No abdominal aortic aneurysm.  PELVIS Ureters: Within normal limits. Bladder: Mild circumferential thickening of the urinary bladder wall. Reproductive system: Mild prostatomegaly.  MSK: Multilevel degenerative changes of the imaged thoracolumbar spine most prominent at L2-L5. Grade 1 anterolisthesis of L5 by 5 mm. Degenerative changes bilateral hips.  Other Result Information  Interface, Rad Results In - 06/07/2018  4:29 PM EDT CT OF THE ABDOMEN AND PELVIS WITHOUT AND WITH INTRAVENOUS CONTRAST (UROGRAPHY), 06/07/2018 1:27 PM   INDICATION: complex cyst \ N28.1 Complex renal cyst   ADDITIONAL HISTORY: History of neurogenic bladder.  COMPARISON: Renal ultrasound 05/27/2018   TECHNIQUE: Axial  images of the abdomen and pelvis were obtained without intravenous contrast. After the administration of intravenous contrast, axial images of the abdomen and pelvis were obtained in the nephrographic and excretory phases. Supplemental  2D reformatted images were generated and reviewed as needed.  All CT scans at [facility] and [place] are performed using dose optimization techniques as appropriate to a performed exam, including but not limited to one or more of the following: automated exposure control, adjustment of the mA and/or kV according to patient size, use of iterative reconstruction technique. In addition, [facility] is participating in the [place] program which will further assist [place] in optimizing patient radiation exposure.  FINDINGS:  LOWER CHEST Heart/vessels: Borderline enlarged heart size. Coronary artery calcifications. Pleura: Within normal limits. Lungs: Bibasilar atelectasis. Right lower lung 5 mm pleural-based nodule (series 5, image 42). Two left lower lobe nodules measuring 5 mm and 3 mm (series 5, image 6 and 11).  ABDOMEN Liver: Normal size and contour. Three hypoattenuating liver lesions, the largest in segment 4A measuring 3.3 x 2.5 cm with peripheral, nodular and discontinuous enhancement and fill in with delayed imaging, consistent with liver hemangiomas. There are also scattered subcentimeter hypoattenuating lesions favored to represent benign cysts. Gallbladder/biliary: Within normal limits. Spleen: Normal size and contour. There is a 1.2 cm splenic hemangioma. Pancreas: Within normal limits. Adrenals: Within normal limits. Kidneys: Uniform cystic left upper pole renal lesion measuring 3.9 x 3.5 x 4.7 cm with internal fluid attenuation, imperceptible walls, and no enhancement with contrast. No internal septations or solid components. No hydronephrosis. Peritoneum/mesenteries: Within normal limits. Extraperitoneum: Within  normal limits. Gastrointestinal tract: Moderate volume of stool throughout the colon. No obstruction. Normal decompressed appearance of the appendix (series 4, image 86). Vascular: Scattered atherosclerotic calcification of the abdominal aorta. Moderate atherosclerotic calcification of the common iliac arteries. No abdominal aortic aneurysm.  PELVIS Ureters: Within normal limits. Bladder: Mild circumferential thickening of the urinary bladder wall. Reproductive system: Mild prostatomegaly.  MSK: Multilevel degenerative changes of the imaged thoracolumbar spine most prominent at L2-L5. Grade 1 anterolisthesis of L5 by 5 mm. Degenerative changes bilateral hips.   CONCLUSION:  1.  Left upper pole 4.9 cm benign simple renal cyst. 2.  Liver hemangiomas and scattered subcentimeter hypoattenuating liver lesions that likely represent benign liver cysts. 3.  Mild circumferential thickening of the urinary bladder wall, consistent with chronic changes related to patient's known history of neurogenic bladder. 4.  Bilateral lower lobe pulmonary nodules with maximal dimension of 5 mm. Recommend nonemergent chest CT for further evaluation.

## 2018-07-08 NOTE — Telephone Encounter (Signed)
Will order a CT chest based on nodules found on CT abdomen, please let the patient know that we have ordered a CT chest and he should receive a call from [place] about this.

## 2018-07-10 ENCOUNTER — Telehealth: Payer: Self-pay | Admitting: Family Medicine

## 2018-07-29 ENCOUNTER — Ambulatory Visit (HOSPITAL_COMMUNITY)
Admission: RE | Admit: 2018-07-29 | Discharge: 2018-07-29 | Disposition: A | Discharge status: Home / Self Care | Payer: Medicare Other | Source: Ambulatory Visit | Attending: Family Medicine | Admitting: Family Medicine

## 2018-07-29 DIAGNOSIS — R918 Other nonspecific abnormal finding of lung field: Secondary | ICD-10-CM | POA: Insufficient documentation

## 2018-07-29 LAB — POCT I-STAT CREATININE: Creatinine, Ser: 0.4 mg/dL — ABNORMAL LOW (ref 0.61–1.24)

## 2018-07-29 MED ORDER — IOHEXOL 300 MG/ML  SOLN
75.0000 mL | Freq: Once | INTRAMUSCULAR | Status: AC | PRN
  Administered 2018-07-29: 75 mL via INTRAVENOUS

## 2019-01-17 ENCOUNTER — Other Ambulatory Visit: Payer: Self-pay

## 2019-01-20 ENCOUNTER — Encounter: Payer: Self-pay | Admitting: Family Medicine

## 2019-01-20 ENCOUNTER — Other Ambulatory Visit: Payer: Self-pay

## 2019-01-20 ENCOUNTER — Ambulatory Visit (INDEPENDENT_AMBULATORY_CARE_PROVIDER_SITE_OTHER): Payer: Medicare Other | Admitting: Family Medicine

## 2019-01-20 VITALS — BP 117/68 | HR 63 | Temp 97.0°F | Ht 68.0 in | Wt 154.6 lb

## 2019-01-20 DIAGNOSIS — E782 Mixed hyperlipidemia: Secondary | ICD-10-CM

## 2019-01-20 DIAGNOSIS — E1069 Type 1 diabetes mellitus with other specified complication: Secondary | ICD-10-CM

## 2019-01-20 DIAGNOSIS — I10 Essential (primary) hypertension: Secondary | ICD-10-CM

## 2019-01-20 DIAGNOSIS — Z1211 Encounter for screening for malignant neoplasm of colon: Secondary | ICD-10-CM

## 2019-01-20 DIAGNOSIS — Z1159 Encounter for screening for other viral diseases: Secondary | ICD-10-CM

## 2019-01-20 LAB — LIPID PANEL

## 2019-01-20 LAB — BAYER DCA HB A1C WAIVED: HB A1C (BAYER DCA - WAIVED): 8 % — ABNORMAL HIGH (ref <7.0)

## 2019-01-20 NOTE — Progress Notes (Signed)
BP 117/68   Pulse 63   Temp (!) 97 F (36.1 C) (Oral)   Ht 5' 8"  (1.727 m)   Wt 154 lb 9.6 oz (70.1 kg)   BMI 23.51 kg/m    Subjective:   Patient ID: Grant Klein., male    DOB: Mar 27, 1946, 73 y.o.   MRN: [id number]  HPI: Grant Baney. is a 73 y.o. male presenting on 01/20/2019 for Medical Management of Chronic Issues (check up of chroinic medical conditions), Allergies, and Establish Care [facility] patient)   HPI  Patient sees endocrinology who manages most of his issues but he comes here once a year to keep his establishment with [place]. Hypertension Patient is currently on enalapril, and their blood pressure today is 117/68. Patient denies any lightheadedness or dizziness. Patient denies headaches, blurred vision, chest pains, shortness of breath, or weakness. Denies any side effects from medication and is content with current medication.   Hyperlipidemia Patient is coming in for recheck of his hyperlipidemia. The patient is currently taking atorvastatin 10. They deny any issues with myalgias or history of liver damage from it. They deny any focal numbness or weakness or chest pain.   Type 1 diabetes Patient comes in today for recheck of his diabetes. Patient has been currently taking Humalog insulin pump. Patient is currently on an ACE inhibitor/ARB. Patient has seen an ophthalmologist this year, says he saw 1 in February or March of this year. Patient denies any issues with their feet.   Patient keeps physically active doing cardio such as bicycling.  Relevant past medical, surgical, family and social history reviewed and updated as indicated. Interim medical history since our last visit reviewed. Allergies and medications reviewed and updated.  Review of Systems  Constitutional: Negative for chills and fever.  Eyes: Negative for visual disturbance.  Respiratory: Negative for shortness of breath and wheezing.   Cardiovascular: Negative for chest pain and leg swelling.   Musculoskeletal: Negative for back pain and gait problem.  Skin: Negative for rash.  Neurological: Negative for dizziness, weakness and numbness.  All other systems reviewed and are negative.   Per HPI unless specifically indicated above   Allergies as of 01/20/2019   No Known Allergies     Medication List       Accurate as of January 20, 2019  9:35 AM. If you have any questions, ask your nurse or doctor.        STOP taking these medications   gabapentin 300 MG capsule Commonly known as: NEURONTIN Stopped by: Fransisca Kaufmann Kelly Ranieri, MD     TAKE these medications   aspirin 81 MG tablet Take by mouth.   atorvastatin 10 MG tablet Commonly known as: LIPITOR Take 1 tablet by mouth daily.   enalapril 10 MG tablet Commonly known as: VASOTEC TAKE 1 TABLET ONCE DAILY.   fluticasone 50 MCG/ACT nasal spray Commonly known as: FLONASE   insulin lispro 100 UNIT/ML injection Commonly known as: HUMALOG For use with insulin pump.  Diagnosis: E10.9   latanoprost 0.005 % ophthalmic solution Commonly known as: XALATAN Place 1 drop into both eyes daily.        Objective:   BP 117/68   Pulse 63   Temp (!) 97 F (36.1 C) (Oral)   Ht 5' 8"  (1.727 m)   Wt 154 lb 9.6 oz (70.1 kg)   BMI 23.51 kg/m   Wt Readings from Last 3 Encounters:  01/20/19 154 lb 9.6 oz (70.1 kg)  04/26/18 152 lb  9.6 oz (69.2 kg)  03/26/18 154 lb 3.2 oz (69.9 kg)    Physical Exam Vitals signs and nursing note reviewed.  Constitutional:      General: He is not in acute distress.    Appearance: He is well-developed. He is not diaphoretic.  Eyes:     General: No scleral icterus.    Conjunctiva/sclera: Conjunctivae normal.  Neck:     Musculoskeletal: Neck supple.     Thyroid: No thyromegaly.  Cardiovascular:     Rate and Rhythm: Normal rate and regular rhythm.     Heart sounds: Normal heart sounds. No murmur.  Pulmonary:     Effort: Pulmonary effort is normal. No respiratory distress.     Breath  sounds: Normal breath sounds. No wheezing.  Musculoskeletal: Normal range of motion.  Lymphadenopathy:     Cervical: No cervical adenopathy.  Skin:    General: Skin is warm and dry.     Findings: No rash.  Neurological:     Mental Status: He is alert and oriented to person, place, and time.     Coordination: Coordination normal.  Psychiatric:        Behavior: Behavior normal.       Assessment & Plan:   Problem List Items Addressed This Visit      Cardiovascular and Mediastinum   Hypertension - Primary   Relevant Orders   CBC with Differential/Platelet   CMP14+EGFR     Endocrine   Type 1 diabetes mellitus ([place])   Relevant Orders   CBC with Differential/Platelet   CMP14+EGFR   Bayer DCA Hb A1c Waived   TSH   Lipid panel     Other   HLD (hyperlipidemia)   Relevant Orders   Lipid panel    Other Visit Diagnoses    Colon cancer screening       Relevant Orders   Cologuard   Need for hepatitis C screening test       Relevant Orders   Hepatitis C antibody      Continue to see endocrinology, continue current medication, will do full panel blood work because he was not able to with endocrinology the last time because they could only do virtual visit. Follow up plan: Return in about 1 year (around 01/20/2020), or if symptoms worsen or fail to improve, for well adult.  Counseling provided for all of the vaccine components Orders Placed This Encounter  Procedures  . CBC with Differential/Platelet  . CMP14+EGFR  . Bayer DCA Hb A1c Waived  . TSH  . Lipid panel  . Cologuard  . Hepatitis C antibody    Caryl Pina, MD [facility] 01/20/2019, 9:35 AM

## 2019-01-21 LAB — CMP14+EGFR
ALT: 19 IU/L (ref 0–44)
AST: 17 IU/L (ref 0–40)
Albumin/Globulin Ratio: 2.4 — ABNORMAL HIGH (ref 1.2–2.2)
Albumin: 4.3 g/dL (ref 3.7–4.7)
Alkaline Phosphatase: 76 IU/L (ref 39–117)
BUN/Creatinine Ratio: 17 (ref 10–24)
BUN: 14 mg/dL (ref 8–27)
Bilirubin Total: 0.5 mg/dL (ref 0.0–1.2)
CO2: 24 mmol/L (ref 20–29)
Calcium: 9.3 mg/dL (ref 8.6–10.2)
Chloride: 103 mmol/L (ref 96–106)
Creatinine, Ser: 0.83 mg/dL (ref 0.76–1.27)
GFR calc Af Amer: 102 mL/min/{1.73_m2} (ref >59)
GFR calc non Af Amer: 89 mL/min/{1.73_m2} (ref >59)
Globulin, Total: 1.8 g/dL (ref 1.5–4.5)
Glucose: 172 mg/dL — ABNORMAL HIGH (ref 65–99)
Potassium: 4.6 mmol/L (ref 3.5–5.2)
Sodium: 140 mmol/L (ref 134–144)
Total Protein: 6.1 g/dL (ref 6.0–8.5)

## 2019-01-21 LAB — CBC WITH DIFFERENTIAL/PLATELET
Basophils Absolute: 0 10*3/uL (ref 0.0–0.2)
Basos: 1 %
EOS (ABSOLUTE): 0.2 10*3/uL (ref 0.0–0.4)
Eos: 3 %
Hematocrit: 42.8 % (ref 37.5–51.0)
Hemoglobin: 14.2 g/dL (ref 13.0–17.7)
Immature Grans (Abs): 0 10*3/uL (ref 0.0–0.1)
Immature Granulocytes: 0 %
Lymphocytes Absolute: 1.3 10*3/uL (ref 0.7–3.1)
Lymphs: 26 %
MCH: 30.7 pg (ref 26.6–33.0)
MCHC: 33.2 g/dL (ref 31.5–35.7)
MCV: 92 fL (ref 79–97)
Monocytes Absolute: 0.5 10*3/uL (ref 0.1–0.9)
Monocytes: 10 %
Neutrophils Absolute: 3.2 10*3/uL (ref 1.4–7.0)
Neutrophils: 60 %
Platelets: 197 10*3/uL (ref 150–450)
RBC: 4.63 x10E6/uL (ref 4.14–5.80)
RDW: 12.7 % (ref 11.6–15.4)
WBC: 5.2 10*3/uL (ref 3.4–10.8)

## 2019-01-21 LAB — HEPATITIS C ANTIBODY: Hep C Virus Ab: <0.1 s/co ratio (ref 0.0–0.9)

## 2019-01-21 LAB — LIPID PANEL
Chol/HDL Ratio: 3.3 ratio (ref 0.0–5.0)
Cholesterol, Total: 184 mg/dL (ref 100–199)
HDL: 56 mg/dL (ref >39)
LDL Calculated: 114 mg/dL — ABNORMAL HIGH (ref 0–99)
Triglycerides: 71 mg/dL (ref 0–149)
VLDL Cholesterol Cal: 14 mg/dL (ref 5–40)

## 2019-01-21 LAB — TSH: TSH: 2.67 u[IU]/mL (ref 0.450–4.500)

## 2019-02-21 LAB — COLOGUARD: Cologuard: NEGATIVE

## 2019-03-03 ENCOUNTER — Ambulatory Visit: Payer: Medicare Other | Admitting: *Deleted

## 2019-03-27 ENCOUNTER — Ambulatory Visit (INDEPENDENT_AMBULATORY_CARE_PROVIDER_SITE_OTHER): Payer: Medicare Other | Admitting: *Deleted

## 2019-03-27 VITALS — BP 117/68 | HR 63 | Ht 68.0 in | Wt 150.0 lb

## 2019-03-27 DIAGNOSIS — Z0000 Encounter for general adult medical examination without abnormal findings: Secondary | ICD-10-CM | POA: Diagnosis not present

## 2019-03-27 NOTE — Progress Notes (Signed)
MEDICARE ANNUAL WELLNESS VISIT  03/27/2019  Telephone Visit Disclaimer This Medicare AWV was conducted by telephone due to national recommendations for restrictions regarding the COVID-19 Pandemic (e.g. social distancing).  I verified, using two identifiers, that I am speaking with Grant Richardson. or their authorized healthcare agent. I discussed the limitations, risks, security, and privacy concerns of performing an evaluation and management service by telephone and the potential availability of an in-person appointment in the future. The patient expressed understanding and agreed to proceed.   Subjective:  Grant Bayle. is a 73 y.o. male patient of Dettinger, Fransisca Kaufmann, MD who had a Medicare Annual Wellness Visit today via telephone. Grant Richardson is Retired and lives with their spouse. he has 2 children. he reports that he is socially active and does interact with friends/family regularly. he is moderately physically active and enjoys gardening.  Patient Care Team: Dettinger, Fransisca Kaufmann, MD as PCP - General (Family Medicine)  Advanced Directives 03/27/2019  Does Patient Have a Medical Advance Directive? Yes  Type of Advance Directive Living will;Healthcare Power of [place] in Chart? No - copy requested    Hospital Utilization Over the Past 12 Months: # of hospitalizations or ER visits: 0 # of surgeries: 0  Review of Systems    Patient reports that his overall health is unchanged compared to last year.  Patient Reported Readings (BP, Pulse, CBG, Weight, etc) BP 117/68 Comment: home reading  Pulse 63   Ht 5\' 8"  (1.727 m)   Wt 150 lb (68 kg)   BMI 22.81 kg/m    Review of Systems: General ROS: negative  All other systems negative.  Pain Assessment       Current Medications & Allergies (verified) Allergies as of 03/27/2019   No Known Allergies     Medication List       Accurate as of March 27, 2019  1:44 PM. If you have any  questions, ask your nurse or doctor.        aspirin 81 MG tablet Take by mouth.   atorvastatin 10 MG tablet Commonly known as: LIPITOR Take 1 tablet by mouth daily.   enalapril 10 MG tablet Commonly known as: VASOTEC TAKE 1 TABLET ONCE DAILY.   fluticasone 50 MCG/ACT nasal spray Commonly known as: FLONASE   insulin lispro 100 UNIT/ML injection Commonly known as: HUMALOG For use with insulin pump.  Diagnosis: E10.9   latanoprost 0.005 % ophthalmic solution Commonly known as: XALATAN Place 1 drop into both eyes daily.       History (reviewed): Past Medical History:  Diagnosis Date  . Diabetes mellitus without complication ([place])   . Hyperlipidemia   . Self-catheterizes urinary bladder   . Spina bifida [facility])    Past Surgical History:  Procedure Laterality Date  . KNEE ARTHROSCOPY Right    Family History  Problem Relation Age of Onset  . Cancer Father        lung  . Osteoporosis Sister   . Migraines Daughter    Social History   Socioeconomic History  . Marital status: Married    Spouse name: [place]  . Number of children: 2  . Years of education: Not on file  . Highest education level: Not on file  Occupational History  . Occupation: retired    Comment: Designer, jewellery  . Financial resource strain: Not on file  . Food insecurity    Worry: Not on file  Inability: Not on file  . Transportation needs    Medical: Not on file    Non-medical: Not on file  Tobacco Use  . Smoking status: Former Research scientist (life sciences)  . Smokeless tobacco: Former Network engineer and Sexual Activity  . Alcohol use: No  . Drug use: No  . Sexual activity: Not on file  Lifestyle  . Physical activity    Days per week: Not on file    Minutes per session: Not on file  . Stress: Not on file  Relationships  . Social Herbalist on phone: Not on file    Gets together: Not on file    Attends religious service: Not on file    Active member of club or organization: Not on  file    Attends meetings of clubs or organizations: Not on file    Relationship status: Not on file  Other Topics Concern  . Not on file  Social History Narrative  . Not on file    Activities of Daily Living In your present state of health, do you have any difficulty performing the following activities: 03/27/2019  Hearing? N  Vision? Y  Comment rx glasses  Difficulty concentrating or making decisions? N  Walking or climbing stairs? N  Dressing or bathing? N  Doing errands, shopping? N  Preparing Food and eating ? N  Using the Toilet? N  In the past six months, have you accidently leaked urine? N  Do you have problems with loss of bowel control? N  Managing your Medications? N  Managing your Finances? N  Housekeeping or managing your Housekeeping? N  Some recent data might be hidden    Patient Education/ Literacy    Exercise Current Exercise Habits: Home exercise routine, Type of exercise: Other - see comments(bike), Time (Minutes): 30, Frequency (Times/Week): 6, Weekly Exercise (Minutes/Week): 180, Intensity: Moderate, Exercise limited by: None identified  Diet Patient reports consuming 3 meals a day and 1 snack(s) a day Patient reports that his primary diet is: Regular/healthy and plant based  Patient reports that she does have regular access to food.   Depression Screen PHQ 2/9 Scores 03/27/2019 01/20/2019 04/26/2018 03/26/2018 11/08/2017 09/13/2017 08/27/2017  PHQ - 2 Score 0 0 0 0 0 0 0     Fall Risk Fall Risk  03/27/2019 01/20/2019 04/26/2018 03/26/2018 11/08/2017  Falls in the past year? 0 0 No No No     Objective:  Grant Richardson. seemed alert and oriented and he participated appropriately during our telephone visit.  Blood Pressure Weight BMI  BP Readings from Last 3 Encounters:  03/27/19 117/68  01/20/19 117/68  04/26/18 120/67   Wt Readings from Last 3 Encounters:  03/27/19 150 lb (68 kg)  01/20/19 154 lb 9.6 oz (70.1 kg)  04/26/18 152 lb 9.6 oz (69.2 kg)    BMI Readings from Last 1 Encounters:  03/27/19 22.81 kg/m    *Unable to obtain current vital signs, weight, and BMI due to telephone visit type  Hearing/Vision  . Grant Richardson did not seem to have difficulty with hearing/understanding during the telephone conversation . Reports that he has not had a formal eye exam by an eye care professional within the past year . Reports that he has not had a formal hearing evaluation within the past year *Unable to fully assess hearing and vision during telephone visit type  Cognitive Function: 6CIT Screen 03/27/2019  What Year? 0 points  What month? 0 points  What time? 0 points  Count back from 20 0 points  Months in reverse 0 points  Repeat phrase 0 points  Total Score 0   (Normal:0-7, Significant for Dysfunction: >8)  Normal Cognitive Function Screening: Yes   Immunization & Health Maintenance Record Immunization History  Administered Date(s) Administered  . Influenza Split 05/15/2008  . Influenza, High Dose Seasonal PF 06/08/2015, 05/20/2018  . Influenza,inj,Quad PF,6+ Mos 05/08/2016, 05/07/2017  . Influenza-Unspecified 04/29/2013, 06/08/2014, 06/08/2015, 05/08/2016, 05/07/2017  . Pneumococcal Polysaccharide-23 05/07/2013  . Tdap 11/04/2013  . Zoster 08/28/2012    Health Maintenance  Topic Date Due  . FOOT EXAM  03/18/1957  . COLONOSCOPY  03/18/1997  . OPHTHALMOLOGY EXAM  01/21/2016  . INFLUENZA VACCINE  03/08/2019  . PNA vac Low Risk Adult (2 of 2 - PCV13) 01/20/2020 (Originally 05/07/2014)  . HEMOGLOBIN A1C  07/22/2019  . TETANUS/TDAP  11/05/2023  . Hepatitis C Screening  Completed       Assessment  This is a routine wellness examination for Grant Richardson.Marland Kitchen  Health Maintenance: Due or Overdue Health Maintenance Due  Topic Date Due  . FOOT EXAM  03/18/1957  . COLONOSCOPY  03/18/1997  . OPHTHALMOLOGY EXAM  01/21/2016  . INFLUENZA VACCINE  03/08/2019    Grant Richardson. does not need a referral for Community  Assistance: Care Management:   no Social Work:    no Prescription Assistance:  no Nutrition/Diabetes Education:  no   Plan:  Personalized Goals Goals Addressed   None    Personalized Health Maintenance & Screening Recommendations  none  Lung Cancer Screening Recommended: no (Low Dose CT Chest recommended if Age 60-80 years, 30 pack-year currently smoking OR have quit w/in past 15 years) Hepatitis C Screening recommended: no HIV Screening recommended: no  Advanced Directives: Written information was not prepared per patient's request.  Referrals & Orders No orders of the defined types were placed in this encounter.   Follow-up Plan . Follow-up with Dettinger, Fransisca Kaufmann, MD as planned    I have personally reviewed and noted the following in the patient's chart:   . Medical and social history . Use of alcohol, tobacco or illicit drugs  . Current medications and supplements . Functional ability and status . Nutritional status . Physical activity . Advanced directives . List of other physicians . Hospitalizations, surgeries, and ER visits in previous 12 months . Vitals . Screenings to include cognitive, depression, and falls . Referrals and appointments  In addition, I have reviewed and discussed with Grant Richardson. certain preventive protocols, quality metrics, and best practice recommendations. A written personalized care plan for preventive services as well as general preventive health recommendations is available and can be mailed to the patient at his request.      [place] Dec  03/27/2019

## 2019-03-27 NOTE — Patient Instructions (Signed)
  Grant Richardson , Thank you for taking time to come for your Medicare Wellness Visit. I appreciate your ongoing commitment to your health goals. Please review the following plan we discussed and let me know if I can assist you in the future.   These are the goals we discussed: Goals   None     This is a list of the screening recommended for you and due dates:  Health Maintenance  Topic Date Due  . Complete foot exam   03/18/1957  . Colon Cancer Screening  03/18/1997  . Eye exam for diabetics  01/21/2016  . Flu Shot  03/08/2019  . Pneumonia vaccines (2 of 2 - PCV13) 01/20/2020*  . Hemoglobin A1C  07/22/2019  . Tetanus Vaccine  11/05/2023  .  Hepatitis C: One time screening is recommended by Center for Disease Control  (CDC) for  adults born from [birth year] through 1964.   Completed  *Topic was postponed. The date shown is not the original due date.

## 2020-01-21 ENCOUNTER — Encounter: Payer: Self-pay | Admitting: Family Medicine

## 2020-01-21 ENCOUNTER — Other Ambulatory Visit: Payer: Self-pay

## 2020-01-21 ENCOUNTER — Ambulatory Visit (INDEPENDENT_AMBULATORY_CARE_PROVIDER_SITE_OTHER): Payer: Medicare Other | Admitting: Family Medicine

## 2020-01-21 VITALS — BP 112/62 | HR 62 | Temp 97.3°F | Ht 68.0 in | Wt 152.0 lb

## 2020-01-21 DIAGNOSIS — Z125 Encounter for screening for malignant neoplasm of prostate: Secondary | ICD-10-CM

## 2020-01-21 DIAGNOSIS — E1069 Type 1 diabetes mellitus with other specified complication: Secondary | ICD-10-CM | POA: Diagnosis not present

## 2020-01-21 DIAGNOSIS — E782 Mixed hyperlipidemia: Secondary | ICD-10-CM | POA: Diagnosis not present

## 2020-01-21 DIAGNOSIS — I10 Essential (primary) hypertension: Secondary | ICD-10-CM | POA: Diagnosis not present

## 2020-01-21 LAB — BAYER DCA HB A1C WAIVED: HB A1C (BAYER DCA - WAIVED): 7.7 % — ABNORMAL HIGH (ref <7.0)

## 2020-01-21 NOTE — Progress Notes (Signed)
BP 112/62   Pulse 62   Temp (!) 97.3 F (36.3 C)   Ht 5' 8"  (1.727 m)   Wt 152 lb (68.9 kg)   SpO2 98%   BMI 23.11 kg/m    Subjective:   Patient ID: Grant Klein., male    DOB: 1946/04/26, 74 y.o.   MRN: [id number]  HPI: Grant Richardson. is a 74 y.o. male presenting on 01/21/2020 for Medical Management of Chronic Issues, Hypertension, and Diabetes   HPI Type 1 diabetes mellitus Patient comes in today for recheck of his diabetes. Patient has been currently taking Humalog in pump, patient has endocrinologist that manages this. Patient is currently on an ACE inhibitor/ARB. Patient has seen an ophthalmologist this year. Patient denies any issues with their feet. The symptom started onset as an adult hypertension ARE RELATED TO DM   Hyperlipidemia Patient is coming in for recheck of his hyperlipidemia. The patient is currently taking atorvastatin. They deny any issues with myalgias or history of liver damage from it. They deny any focal numbness or weakness or chest pain.   Hypertension Patient is currently on enalapril, and their blood pressure today is 112/62. Patient denies any lightheadedness or dizziness. Patient denies headaches, blurred vision, chest pains, shortness of breath, or weakness. Denies any side effects from medication and is content with current medication.   Relevant past medical, surgical, family and social history reviewed and updated as indicated. Interim medical history since our last visit reviewed. Allergies and medications reviewed and updated.  Review of Systems  Constitutional: Negative for chills and fever.  Eyes: Negative for visual disturbance.  Respiratory: Negative for shortness of breath and wheezing.   Cardiovascular: Negative for chest pain and leg swelling.  Musculoskeletal: Negative for back pain and gait problem.  Skin: Negative for rash.  Neurological: Negative for dizziness, weakness and light-headedness.  All other systems reviewed  and are negative.   Per HPI unless specifically indicated above   Allergies as of 01/21/2020   No Known Allergies     Medication List       Accurate as of January 21, 2020  9:13 AM. If you have any questions, ask your nurse or doctor.        STOP taking these medications   fluticasone 50 MCG/ACT nasal spray Commonly known as: FLONASE Stopped by: Fransisca Kaufmann Luismario Coston, MD     TAKE these medications   aspirin 81 MG tablet Take by mouth.   atorvastatin 10 MG tablet Commonly known as: LIPITOR Take 1 tablet by mouth daily.   enalapril 10 MG tablet Commonly known as: VASOTEC TAKE 1 TABLET ONCE DAILY.   insulin lispro 100 UNIT/ML injection Commonly known as: HUMALOG For use with insulin pump.  Diagnosis: E10.9   latanoprost 0.005 % ophthalmic solution Commonly known as: XALATAN Place 1 drop into both eyes daily.        Objective:   BP 112/62   Pulse 62   Temp (!) 97.3 F (36.3 C)   Ht 5' 8"  (1.727 m)   Wt 152 lb (68.9 kg)   SpO2 98%   BMI 23.11 kg/m   Wt Readings from Last 3 Encounters:  01/21/20 152 lb (68.9 kg)  03/27/19 150 lb (68 kg)  01/20/19 154 lb 9.6 oz (70.1 kg)    Physical Exam Vitals and nursing note reviewed.  Constitutional:      General: He is not in acute distress.    Appearance: He is well-developed. He is not diaphoretic.  Eyes:     General: No scleral icterus.    Conjunctiva/sclera: Conjunctivae normal.  Neck:     Thyroid: No thyromegaly.  Cardiovascular:     Rate and Rhythm: Normal rate and regular rhythm.     Heart sounds: Normal heart sounds. No murmur heard.   Pulmonary:     Effort: Pulmonary effort is normal. No respiratory distress.     Breath sounds: Normal breath sounds. No wheezing.  Musculoskeletal:        General: Normal range of motion.     Cervical back: Neck supple.  Lymphadenopathy:     Cervical: No cervical adenopathy.  Skin:    General: Skin is warm and dry.     Findings: No rash.  Neurological:     Mental  Status: He is alert and oriented to person, place, and time.     Coordination: Coordination normal.  Psychiatric:        Behavior: Behavior normal.       Assessment & Plan:   Problem List Items Addressed This Visit      Cardiovascular and Mediastinum   Hypertension   Relevant Orders   CMP14+EGFR     Endocrine   Type 1 diabetes mellitus ([place]) - Primary   Relevant Orders   Bayer DCA Hb A1c Waived   CBC with Differential/Platelet   CMP14+EGFR   Lipid panel   TSH     Other   HLD (hyperlipidemia)   Relevant Orders   Lipid panel    Other Visit Diagnoses    Prostate cancer screening       Relevant Orders   PSA, total and free      A1c is 7.7, continue to follow-up with endocrinology  Follow-up in 1 year blood pressure looks good  Follow up plan: Return in about 1 year (around 01/20/2021), or if symptoms worsen or fail to improve.  Counseling provided for all of the vaccine components Orders Placed This Encounter  Procedures  . Bayer [facility] Hb A1c Duncan, MD [facility] 01/21/2020, 9:13 AM

## 2020-01-22 LAB — CBC WITH DIFFERENTIAL/PLATELET
Basophils Absolute: 0 10*3/uL (ref 0.0–0.2)
Basos: 1 %
EOS (ABSOLUTE): 0.1 10*3/uL (ref 0.0–0.4)
Eos: 3 %
Hematocrit: 40.4 % (ref 37.5–51.0)
Hemoglobin: 13.7 g/dL (ref 13.0–17.7)
Immature Grans (Abs): 0 10*3/uL (ref 0.0–0.1)
Immature Granulocytes: 0 %
Lymphocytes Absolute: 1 10*3/uL (ref 0.7–3.1)
Lymphs: 21 %
MCH: 29.9 pg (ref 26.6–33.0)
MCHC: 33.9 g/dL (ref 31.5–35.7)
MCV: 88 fL (ref 79–97)
Monocytes Absolute: 0.5 10*3/uL (ref 0.1–0.9)
Monocytes: 11 %
Neutrophils Absolute: 3 10*3/uL (ref 1.4–7.0)
Neutrophils: 64 %
Platelets: 155 10*3/uL (ref 150–450)
RBC: 4.58 x10E6/uL (ref 4.14–5.80)
RDW: 12.6 % (ref 11.6–15.4)
WBC: 4.7 10*3/uL (ref 3.4–10.8)

## 2020-01-22 LAB — CMP14+EGFR
ALT: 14 IU/L (ref 0–44)
AST: 12 IU/L (ref 0–40)
Albumin/Globulin Ratio: 1.9 (ref 1.2–2.2)
Albumin: 4.2 g/dL (ref 3.7–4.7)
Alkaline Phosphatase: 86 IU/L (ref 48–121)
BUN/Creatinine Ratio: 19 (ref 10–24)
BUN: 15 mg/dL (ref 8–27)
Bilirubin Total: 0.7 mg/dL (ref 0.0–1.2)
CO2: 24 mmol/L (ref 20–29)
Calcium: 9.2 mg/dL (ref 8.6–10.2)
Chloride: 99 mmol/L (ref 96–106)
Creatinine, Ser: 0.81 mg/dL (ref 0.76–1.27)
GFR calc Af Amer: 103 mL/min/{1.73_m2} (ref >59)
GFR calc non Af Amer: 89 mL/min/{1.73_m2} (ref >59)
Globulin, Total: 2.2 g/dL (ref 1.5–4.5)
Glucose: 240 mg/dL — ABNORMAL HIGH (ref 65–99)
Potassium: 4.8 mmol/L (ref 3.5–5.2)
Sodium: 135 mmol/L (ref 134–144)
Total Protein: 6.4 g/dL (ref 6.0–8.5)

## 2020-01-22 LAB — PSA, TOTAL AND FREE
PSA, Free Pct: 15.8 %
PSA, Free: 0.52 ng/mL
Prostate Specific Ag, Serum: 3.3 ng/mL (ref 0.0–4.0)

## 2020-01-22 LAB — LIPID PANEL
Chol/HDL Ratio: 3.8 ratio (ref 0.0–5.0)
Cholesterol, Total: 174 mg/dL (ref 100–199)
HDL: 46 mg/dL (ref >39)
LDL Chol Calc (NIH): 103 mg/dL — ABNORMAL HIGH (ref 0–99)
Triglycerides: 143 mg/dL (ref 0–149)
VLDL Cholesterol Cal: 25 mg/dL (ref 5–40)

## 2020-01-22 LAB — TSH: TSH: 2.27 u[IU]/mL (ref 0.450–4.500)

## 2020-02-27 ENCOUNTER — Telehealth: Payer: Self-pay | Admitting: Family Medicine

## 2020-02-27 DIAGNOSIS — E1069 Type 1 diabetes mellitus with other specified complication: Secondary | ICD-10-CM

## 2020-02-27 NOTE — Telephone Encounter (Signed)
Pt needs a CPetide test as a requirement for medicare to get a insulin pump. Please call pt when he can come in for the test.

## 2020-02-27 NOTE — Telephone Encounter (Signed)
Are you ok with ordering the C Peptide although he sees Endocrinology for DM management?

## 2020-03-02 ENCOUNTER — Other Ambulatory Visit: Payer: Self-pay

## 2020-03-02 DIAGNOSIS — E1069 Type 1 diabetes mellitus with other specified complication: Secondary | ICD-10-CM

## 2020-03-02 NOTE — Telephone Encounter (Signed)
Pt checking status on below. Pt aware Dr. Warrick Parisian is on vacation.

## 2020-03-02 NOTE — Telephone Encounter (Signed)
Pt will come in later this week to the lab between 8-4. He will be fasting

## 2020-03-04 ENCOUNTER — Other Ambulatory Visit: Payer: Self-pay

## 2020-03-04 ENCOUNTER — Other Ambulatory Visit: Payer: Medicare Other

## 2020-03-04 DIAGNOSIS — E1069 Type 1 diabetes mellitus with other specified complication: Secondary | ICD-10-CM

## 2020-03-04 LAB — BMP8+EGFR
BUN/Creatinine Ratio: 15 (ref 10–24)
BUN: 13 mg/dL (ref 8–27)
CO2: 25 mmol/L (ref 20–29)
Calcium: 9.2 mg/dL (ref 8.6–10.2)
Chloride: 99 mmol/L (ref 96–106)
Creatinine, Ser: 0.84 mg/dL (ref 0.76–1.27)
GFR calc Af Amer: 101 mL/min/{1.73_m2} (ref >59)
GFR calc non Af Amer: 87 mL/min/{1.73_m2} (ref >59)
Glucose: 122 mg/dL — ABNORMAL HIGH (ref 65–99)
Potassium: 4.5 mmol/L (ref 3.5–5.2)
Sodium: 141 mmol/L (ref 134–144)

## 2020-03-05 LAB — C-PEPTIDE: C-Peptide: <0.1 ng/mL — ABNORMAL LOW (ref 1.1–4.4)

## 2020-03-08 NOTE — Telephone Encounter (Signed)
Yes go ahead and add C-peptide.

## 2020-03-08 NOTE — Telephone Encounter (Signed)
Lab added

## 2020-03-08 NOTE — Addendum Note (Signed)
Addended by: Nigel Berthold C on: 03/08/2020 11:20 AM   Modules accepted: Orders

## 2021-01-20 ENCOUNTER — Other Ambulatory Visit: Payer: Self-pay

## 2021-01-20 ENCOUNTER — Ambulatory Visit (INDEPENDENT_AMBULATORY_CARE_PROVIDER_SITE_OTHER): Payer: Medicare Other | Admitting: Family Medicine

## 2021-01-20 ENCOUNTER — Encounter: Payer: Self-pay | Admitting: Family Medicine

## 2021-01-20 VITALS — BP 120/63 | HR 65 | Ht 68.0 in | Wt 150.0 lb

## 2021-01-20 DIAGNOSIS — E1069 Type 1 diabetes mellitus with other specified complication: Secondary | ICD-10-CM

## 2021-01-20 DIAGNOSIS — I10 Essential (primary) hypertension: Secondary | ICD-10-CM

## 2021-01-20 DIAGNOSIS — E782 Mixed hyperlipidemia: Secondary | ICD-10-CM

## 2021-01-20 LAB — BAYER DCA HB A1C WAIVED: HB A1C (BAYER DCA - WAIVED): 7.6 % — ABNORMAL HIGH (ref <7.0)

## 2021-01-20 NOTE — Progress Notes (Signed)
BP 120/63   Pulse 65   Ht 5' 8"  (1.727 m)   Wt 150 lb (68 kg)   SpO2 99%   BMI 22.81 kg/m    Subjective:   Patient ID: Grant Klein., male    DOB: 06-Nov-1945, 75 y.o.   MRN: [id number]  HPI: Grant Beagle. is a 75 y.o. male presenting on 01/20/2021 for Medical Management of Chronic Issues, Hypertension, and Diabetes   HPI Type 1 diabetes mellitus Patient comes in today for recheck of his diabetes, also sees endocrinology. Patient has been currently taking Humalog pump. Patient is currently on an ACE inhibitor/ARB. Patient has not seen an ophthalmologist this year. Patient denies any issues with their feet. The symptom started onset as an adult hyperlipidemia and hypertension ARE RELATED TO DM.  A1c up just slightly at 7.6.  He will focus on managing the mealtime a little bit better, looks like specifically the lunchtime bumped up on him slightly.  He brought freestyle libre meter with him  Hyperlipidemia Patient is coming in for recheck of his hyperlipidemia. The patient is currently taking atorvastatin. They deny any issues with myalgias or history of liver damage from it. They deny any focal numbness or weakness or chest pain.   Hypertension Patient is currently on enalapril, and their blood pressure today is 120/63. Patient denies any lightheadedness or dizziness. Patient denies headaches, blurred vision, chest pains, shortness of breath, or weakness. Denies any side effects from medication and is content with current medication.   Relevant past medical, surgical, family and social history reviewed and updated as indicated. Interim medical history since our last visit reviewed. Allergies and medications reviewed and updated.  Review of Systems  Constitutional:  Negative for chills and fever.  Eyes:  Negative for visual disturbance.  Respiratory:  Negative for shortness of breath and wheezing.   Cardiovascular:  Negative for chest pain and leg swelling.  Musculoskeletal:   Negative for back pain and gait problem.  Skin:  Negative for rash.  Neurological:  Negative for dizziness, weakness and light-headedness.  All other systems reviewed and are negative.  Per HPI unless specifically indicated above   Allergies as of 01/20/2021   No Known Allergies      Medication List        Accurate as of January 20, 2021  9:01 AM. If you have any questions, ask your nurse or doctor.          aspirin 81 MG tablet Take by mouth.   atorvastatin 10 MG tablet Commonly known as: LIPITOR Take 1 tablet by mouth daily.   enalapril 10 MG tablet Commonly known as: VASOTEC TAKE 1 TABLET ONCE DAILY.   insulin lispro 100 UNIT/ML injection Commonly known as: HUMALOG For use with insulin pump.  Diagnosis: E10.9   latanoprost 0.005 % ophthalmic solution Commonly known as: XALATAN Place 1 drop into both eyes daily.         Objective:   BP 120/63   Pulse 65   Ht 5' 8"  (1.727 m)   Wt 150 lb (68 kg)   SpO2 99%   BMI 22.81 kg/m   Wt Readings from Last 3 Encounters:  01/20/21 150 lb (68 kg)  01/21/20 152 lb (68.9 kg)  03/27/19 150 lb (68 kg)    Physical Exam Vitals and nursing note reviewed.  Constitutional:      General: He is not in acute distress.    Appearance: He is well-developed. He is not diaphoretic.  Eyes:  General: No scleral icterus.    Conjunctiva/sclera: Conjunctivae normal.  Neck:     Thyroid: No thyromegaly.  Cardiovascular:     Rate and Rhythm: Normal rate and regular rhythm.     Heart sounds: Normal heart sounds. No murmur heard. Pulmonary:     Effort: Pulmonary effort is normal. No respiratory distress.     Breath sounds: Normal breath sounds. No wheezing.  Musculoskeletal:        General: No swelling. Normal range of motion.     Cervical back: Neck supple.  Lymphadenopathy:     Cervical: No cervical adenopathy.  Skin:    General: Skin is warm and dry.     Findings: No rash.  Neurological:     Mental Status: He is alert  and oriented to person, place, and time.     Coordination: Coordination normal.  Psychiatric:        Behavior: Behavior normal.      Assessment & Plan:   Problem List Items Addressed This Visit       Cardiovascular and Mediastinum   Hypertension     Endocrine   Type 1 diabetes mellitus ([place]) - Primary   Relevant Orders   CBC with Differential/Platelet   CMP14+EGFR   Lipid panel   Bayer DCA Hb A1c Waived     Other   HLD (hyperlipidemia)   Relevant Orders   CBC with Differential/Platelet   CMP14+EGFR   Lipid panel   Bayer DCA Hb A1c Waived   Other Visit Diagnoses     Essential hypertension       Relevant Orders   CBC with Differential/Platelet   CMP14+EGFR   Lipid panel   Bayer DCA Hb A1c Waived       Patient has an A1c slightly up, sees endocrinology, will follow up for yearly but they mainly follow-up with diabetes and manage most things. Follow up plan: Return in about 1 year (around 01/20/2022) for Physical exam.  Counseling provided for all of the vaccine components Orders Placed This Encounter  Procedures   CBC with Differential/Platelet   CMP14+EGFR   Lipid panel   Bayer DCA Hb A1c Waived    Caryl Pina, MD [facility] 01/20/2021, 9:01 AM

## 2021-01-21 LAB — CBC WITH DIFFERENTIAL/PLATELET
Basophils Absolute: 0.1 10*3/uL (ref 0.0–0.2)
Basos: 1 %
EOS (ABSOLUTE): 0.1 10*3/uL (ref 0.0–0.4)
Eos: 2 %
Hematocrit: 43.1 % (ref 37.5–51.0)
Hemoglobin: 14.7 g/dL (ref 13.0–17.7)
Immature Grans (Abs): 0 10*3/uL (ref 0.0–0.1)
Immature Granulocytes: 0 %
Lymphocytes Absolute: 1.4 10*3/uL (ref 0.7–3.1)
Lymphs: 24 %
MCH: 30.8 pg (ref 26.6–33.0)
MCHC: 34.1 g/dL (ref 31.5–35.7)
MCV: 90 fL (ref 79–97)
Monocytes Absolute: 0.6 10*3/uL (ref 0.1–0.9)
Monocytes: 11 %
Neutrophils Absolute: 3.7 10*3/uL (ref 1.4–7.0)
Neutrophils: 62 %
Platelets: 166 10*3/uL (ref 150–450)
RBC: 4.78 x10E6/uL (ref 4.14–5.80)
RDW: 13.1 % (ref 11.6–15.4)
WBC: 5.9 10*3/uL (ref 3.4–10.8)

## 2021-01-21 LAB — CMP14+EGFR
ALT: 18 IU/L (ref 0–44)
AST: 16 IU/L (ref 0–40)
Albumin/Globulin Ratio: 2.2 (ref 1.2–2.2)
Albumin: 4.1 g/dL (ref 3.7–4.7)
Alkaline Phosphatase: 89 IU/L (ref 44–121)
BUN/Creatinine Ratio: 19 (ref 10–24)
BUN: 16 mg/dL (ref 8–27)
Bilirubin Total: 0.4 mg/dL (ref 0.0–1.2)
CO2: 22 mmol/L (ref 20–29)
Calcium: 9.1 mg/dL (ref 8.6–10.2)
Chloride: 102 mmol/L (ref 96–106)
Creatinine, Ser: 0.85 mg/dL (ref 0.76–1.27)
Globulin, Total: 1.9 g/dL (ref 1.5–4.5)
Glucose: 89 mg/dL (ref 65–99)
Potassium: 4.8 mmol/L (ref 3.5–5.2)
Sodium: 141 mmol/L (ref 134–144)
Total Protein: 6 g/dL (ref 6.0–8.5)
eGFR: 92 mL/min/{1.73_m2} (ref >59)

## 2021-01-21 LAB — LIPID PANEL
Chol/HDL Ratio: 3.4 ratio (ref 0.0–5.0)
Cholesterol, Total: 179 mg/dL (ref 100–199)
HDL: 52 mg/dL (ref >39)
LDL Chol Calc (NIH): 106 mg/dL — ABNORMAL HIGH (ref 0–99)
Triglycerides: 115 mg/dL (ref 0–149)
VLDL Cholesterol Cal: 21 mg/dL (ref 5–40)

## 2021-06-02 ENCOUNTER — Ambulatory Visit (INDEPENDENT_AMBULATORY_CARE_PROVIDER_SITE_OTHER): Payer: Medicare Other

## 2021-06-02 VITALS — Ht 68.0 in | Wt 150.0 lb

## 2021-06-02 DIAGNOSIS — Z0000 Encounter for general adult medical examination without abnormal findings: Secondary | ICD-10-CM

## 2021-06-02 NOTE — Progress Notes (Signed)
Subjective:   Tashawn Laswell. is a 75 y.o. male who presents for Medicare Annual/Subsequent preventive examination.  Virtual Visit via Telephone Note  I connected with  Sanda Klein. on 06/02/21 at  9:00 AM EDT by telephone and verified that I am speaking with the correct person using two identifiers.  Location: Patient: Home Provider: [facility] Persons participating in the virtual visit: patient/Nurse Health Advisor   I discussed the limitations, risks, security and privacy concerns of performing an evaluation and management service by telephone and the availability of in person appointments. The patient expressed understanding and agreed to proceed.  Interactive audio and video telecommunications were attempted between this nurse and patient, however failed, due to patient having technical difficulties OR patient did not have access to video capability.  We continued and completed visit with audio only.  Some vital signs may be absent or patient reported.   Skyelyn Scruggs E Jolayne Branson, LPN   Review of Systems     Cardiac Risk Factors include: advanced age (>54men, >19 women);diabetes mellitus;dyslipidemia;hypertension;male gender     Objective:    Today's Vitals   06/02/21 0904  Weight: 150 lb (68 kg)  Height: 5\' 8"  (1.727 m)   Body mass index is 22.81 kg/m.  Advanced Directives 06/02/2021 03/27/2019  Does Patient Have a Medical Advance Directive? Yes Yes  Type of Paramedic of [place];Living will Living will;Healthcare Power of [place] in Chart? No - copy requested No - copy requested    Current Medications (verified) Outpatient Encounter Medications as of 06/02/2021  Medication Sig   aspirin 81 MG EC tablet Take by mouth.   atorvastatin (LIPITOR) 10 MG tablet Take 1 tablet by mouth daily.   enalapril (VASOTEC) 10 MG tablet Take 1 tablet by mouth daily.   fluticasone (FLONASE) 50 MCG/ACT nasal spray Place into the  nose.   Glucagon 1 MG/0.2ML SOAJ 1 mg.   Insulin Infusion Pump Supplies (AUTOSOFT XC INFUSION SET) MISC Infusion set change every 3 days- Minimed Quick set - MMt 396, length 43 inches (110 cm), compatible with insulin pump Paradigm [id number]   Insulin Infusion Pump Supplies (PARADIGM PUMP RESERVOIR 1.8ML) MISC Change every 3 days. Dispense 3 month supply (30 ea). Reservoir 326A   insulin lispro (HUMALOG) 100 UNIT/ML injection For use with insulin pump.  Diagnosis: E10.9   latanoprost (XALATAN) 0.005 % ophthalmic solution Place 1 drop into both eyes daily.   [DISCONTINUED] Insulin Infusion Pump Supplies (PARADIGM PUMP RESERVOIR 1.8ML) MISC Change every 3 days. Dispense 3 month supply (30 ea). Reservoir 326A   [DISCONTINUED] aspirin 81 MG tablet Take by mouth.   [DISCONTINUED] enalapril (VASOTEC) 10 MG tablet TAKE 1 TABLET ONCE DAILY.   No facility-administered encounter medications on file as of 06/02/2021.    Allergies (verified) Patient has no known allergies.   History: Past Medical History:  Diagnosis Date   Diabetes mellitus without complication ([place])    Hyperlipidemia    Self-catheterizes urinary bladder    Spina bifida ([place])    Past Surgical History:  Procedure Laterality Date   KNEE ARTHROSCOPY Right    Family History  Problem Relation Age of Onset   Cancer Father        lung   Osteoporosis Sister    Migraines Daughter    Social History   Socioeconomic History   Marital status: Married    Spouse name: Tandy Gaw   Number of children: 2   Years of education: Not  on file   Highest education level: Not on file  Occupational History   Occupation: retired    Comment: [employer]  Tobacco Use   Smoking status: Former   Smokeless tobacco: Former  Scientific laboratory technician Use: Never used  Substance and Sexual Activity   Alcohol use: No   Drug use: No   Sexual activity: Not on file  Other Topics Concern   Not on file  Social History Narrative   Grandson lives with  them   Social Determinants of Health   Financial Resource Strain: Low Risk    Difficulty of Paying Living Expenses: Not hard at all  Food Insecurity: No Food Insecurity   Worried About Charity fundraiser in the Last Year: Never true   Arboriculturist in the Last Year: Never true  Transportation Needs: No Transportation Needs   Lack of Transportation (Medical): No   Lack of Transportation (Non-Medical): No  Physical Activity: Insufficiently Active   Days of Exercise per Week: 6 days   Minutes of Exercise per Session: 20 min  Stress: No Stress Concern Present   Feeling of Stress : Not at all  Social Connections: Socially Integrated   Frequency of Communication with Friends and Family: More than three times a week   Frequency of Social Gatherings with Friends and Family: Twice a week   Attends Religious Services: More than 4 times per year   Active Member of [employer] or Organizations: Yes   Attends Archivist Meetings: 1 to 4 times per year   Marital Status: Married    Tobacco Counseling Counseling given: Not Answered   Clinical Intake:  Pre-visit preparation completed: Yes  Pain : No/denies pain     BMI - recorded: 22.81 Nutritional Status: BMI of 19-24  Normal Nutritional Risks: None Diabetes: Yes CBG done?: No Did pt. bring in CBG monitor from home?: No  How often do you need to have someone help you when you read instructions, pamphlets, or other written materials from your doctor or pharmacy?: 1 - Never  Diabetic? Yes Nutrition Risk Assessment:  Has the patient had any N/V/D within the last 2 months?  No  Does the patient have any non-healing wounds?  No  Has the patient had any unintentional weight loss or weight gain?  No   Diabetes:  Is the patient diabetic?  Yes  If diabetic, was a CBG obtained today?  No  Did the patient bring in their glucometer from home?  No  How often do you monitor your CBG's? Freestyle Libre - several times per day - has  insulin pump  Financial Strains and Diabetes Management:  Are you having any financial strains with the device, your supplies or your medication? No .  Does the patient want to be seen by Chronic Care Management for management of their diabetes?  No  Would the patient like to be referred to a Nutritionist or for Diabetic Management?  No   Diabetic Exams:  Diabetic Eye Exam: Completed 09/2020.  Diabetic Foot Exam: Completed 01/21/2020. Pt has been advised about the importance in completing this exam. Pt is scheduled for diabetic foot exam on next visit.    Interpreter Needed?: No  Information entered by :: Monroe Qin, LPN   Activities of Daily Living In your present state of health, do you have any difficulty performing the following activities: 06/02/2021  Hearing? N  Vision? N  Difficulty concentrating or making decisions? N  Walking or  climbing stairs? Y  Dressing or bathing? N  Doing errands, shopping? N  Preparing Food and eating ? N  Using the Toilet? N  In the past six months, have you accidently leaked urine? N  Comment neurogenic bladder - self-catheterizes - routine visits with Urology  Do you have problems with loss of bowel control? N  Managing your Medications? N  Managing your Finances? N  Housekeeping or managing your Housekeeping? N  Some recent data might be hidden    Patient Care Team: Dettinger, Fransisca Kaufmann, MD as PCP - General (Family Medicine)  Indicate any recent Medical Services you may have received from other than [facility] providers in the past year (date may be approximate).     Assessment:   This is a routine wellness examination for [place].  Hearing/Vision screen Hearing Screening - Comments:: Denies hearing difficulties  Vision Screening - Comments:: Wears rx glasses - up to date with annual ey exams at [place] at [place] issues and exercise activities discussed: Current Exercise Habits: Home exercise routine, Type of  exercise: walking;stretching, Time (Minutes): 20, Frequency (Times/Week): 6, Weekly Exercise (Minutes/Week): 120, Intensity: Mild, Exercise limited by: neurologic condition(s);orthopedic condition(s)   Goals Addressed             This Visit's Progress    Exercise 3x per week (30 min per time)         Depression Screen PHQ 2/9 Scores 06/02/2021 01/20/2021 01/21/2020 03/27/2019 01/20/2019 04/26/2018 03/26/2018  PHQ - 2 Score 0 0 0 0 0 0 0    Fall Risk Fall Risk  06/02/2021 01/20/2021 01/21/2020 03/27/2019 01/20/2019  Falls in the past year? 0 0 0 0 0  Number falls in past yr: 0 - - - -  Injury with Fall? 0 - - - -  Risk for fall due to : Impaired balance/gait;Orthopedic patient - - - -  Follow up Falls prevention discussed - - - -    FALL RISK PREVENTION PERTAINING TO THE HOME:  Any stairs in or around the home? No  If so, are there any without handrails? No  Home free of loose throw rugs in walkways, pet beds, electrical cords, etc? Yes  Adequate lighting in your home to reduce risk of falls? Yes   ASSISTIVE DEVICES UTILIZED TO PREVENT FALLS:  Life alert? No  Use of a cane, walker or w/c? No  Grab bars in the bathroom? No  Shower chair or bench in shower? No  Elevated toilet seat or a handicapped toilet? No   TIMED UP AND GO:  Was the test performed? No . Telephonic visit.  Cognitive Function: Normal cognitive status assessed by direct observation by this Nurse Health Advisor. No abnormalities found.       6CIT Screen 03/27/2019  What Year? 0 points  What month? 0 points  What time? 0 points  Count back from 20 0 points  Months in reverse 0 points  Repeat phrase 0 points  Total Score 0    Immunizations Immunization History  Administered Date(s) Administered   Influenza Split 05/30/2006, 05/15/2008, 05/09/2019   Influenza, High Dose Seasonal PF 06/08/2015, 05/20/2018   Influenza,inj,Quad PF,6+ Mos 05/08/2016, 05/07/2017   Influenza-Unspecified 04/29/2013,  06/08/2014, 06/08/2015, 05/08/2016, 05/07/2017, 05/24/2017, 05/09/2019, 05/24/2020   Moderna Sars-Covid-2 Vaccination 08/27/2019, 09/24/2019, 07/07/2020, 01/19/2021   Pneumococcal Polysaccharide-23 05/07/2013   Tdap 11/04/2013   Zoster, Live 08/28/2012    TDAP status: Up to date  Flu Vaccine status: Up to date  Pneumococcal vaccine  status: Due, Education has been provided regarding the importance of this vaccine. Advised may receive this vaccine at local pharmacy or Health Dept. Aware to provide a copy of the vaccination record if obtained from local pharmacy or Health Dept. Verbalized acceptance and understanding.  Covid-19 vaccine status: Completed vaccines  Qualifies for Shingles Vaccine? Yes   Zostavax completed Yes   Shingrix Completed?: No.    Education has been provided regarding the importance of this vaccine. Patient has been advised to call insurance company to determine out of pocket expense if they have not yet received this vaccine. Advised may also receive vaccine at local pharmacy or Health Dept. Verbalized acceptance and understanding.  Screening Tests Health Maintenance  Topic Date Due   Zoster Vaccines- Shingrix (1 of 2) Never done   Pneumonia Vaccine 59+ Years old (2 - PCV) 05/07/2014   OPHTHALMOLOGY EXAM  09/08/2020   FOOT EXAM  01/20/2021   INFLUENZA VACCINE  03/07/2021   COVID-19 Vaccine (5 - Booster for Moderna series) 03/16/2021   HEMOGLOBIN A1C  07/22/2021   TETANUS/TDAP  11/05/2023   Hepatitis C Screening  Completed   HPV VACCINES  Aged Out   COLONOSCOPY (Pts 45-78yrs Insurance coverage will need to be confirmed)  Discontinued    Health Maintenance  Health Maintenance Due  Topic Date Due   Zoster Vaccines- Shingrix (1 of 2) Never done   Pneumonia Vaccine 17+ Years old (2 - PCV) 05/07/2014   OPHTHALMOLOGY EXAM  09/08/2020   FOOT EXAM  01/20/2021   INFLUENZA VACCINE  03/07/2021   COVID-19 Vaccine (5 - Booster for Moderna series) 03/16/2021     Colorectal cancer screening: No longer required.   Lung Cancer Screening: (Low Dose CT Chest recommended if Age 40-80 years, 30 pack-year currently smoking OR have quit w/in 15years.) does not qualify.   Additional Screening:  Hepatitis C Screening: does qualify; Completed 01/20/2019  Vision Screening: Recommended annual ophthalmology exams for early detection of glaucoma and other disorders of the eye. Is the patient up to date with their annual eye exam?  Yes  Who is the provider or what is the name of the office in which the patient attends annual eye exams? [facility] If pt is not established with a provider, would they like to be referred to a provider to establish care? No .   Dental Screening: Recommended annual dental exams for proper oral hygiene  Community Resource Referral / Chronic Care Management: CRR required this visit?  No   CCM required this visit?  No      Plan:     I have personally reviewed and noted the following in the patient's chart:   Medical and social history Use of alcohol, tobacco or illicit drugs  Current medications and supplements including opioid prescriptions. Patient is not currently taking opioid prescriptions. Functional ability and status Nutritional status Physical activity Advanced directives List of other physicians Hospitalizations, surgeries, and ER visits in previous 12 months Vitals Screenings to include cognitive, depression, and falls Referrals and appointments  In addition, I have reviewed and discussed with patient certain preventive protocols, quality metrics, and best practice recommendations. A written personalized care plan for preventive services as well as general preventive health recommendations were provided to patient.     Sandrea Hammond, LPN   [id number]   Nurse Notes: None

## 2021-06-02 NOTE — Patient Instructions (Signed)
Grant Richardson , Thank you for taking time to come for your Medicare Wellness Visit. I appreciate your ongoing commitment to your health goals. Please review the following plan we discussed and let me know if I can assist you in the future.   Screening recommendations/referrals: Colonoscopy: Declined Recommended yearly ophthalmology/optometry visit for glaucoma screening and checkup Recommended yearly dental visit for hygiene and checkup  Vaccinations: Influenza vaccine: Done 06/02/2021 - Repeat annually  Pneumococcal vaccine: Done 05/07/2013 - due for Prevnar-13 Tdap vaccine: Done 11/04/2013 - Repeat in 10 years Shingles vaccine: Zostavax done 2014 - due for Shingrix   Covid-19: Done 08/27/2019, 09/24/2019, 07/07/2020, & 01/19/2021  Advanced directives: Please bring a copy of your health care power of attorney and living will to the office to be added to your chart at your convenience.   Conditions/risks identified: Aim for 30 minutes of exercise or brisk walking each day, drink 6-8 glasses of water and eat lots of fruits and vegetables.   Next appointment: Follow up in one year for your annual wellness visit.   Preventive Care 74 Years and Older, Male  Preventive care refers to lifestyle choices and visits with your health care provider that can promote health and wellness. What does preventive care include? A yearly physical exam. This is also called an annual well check. Dental exams once or twice a year. Routine eye exams. Ask your health care provider how often you should have your eyes checked. Personal lifestyle choices, including: Daily care of your teeth and gums. Regular physical activity. Eating a healthy diet. Avoiding tobacco and drug use. Limiting alcohol use. Practicing safe sex. Taking low doses of aspirin every day. Taking vitamin and mineral supplements as recommended by your health care provider. What happens during an annual well check? The services and screenings  done by your health care provider during your annual well check will depend on your age, overall health, lifestyle risk factors, and family history of disease. Counseling  Your health care provider may ask you questions about your: Alcohol use. Tobacco use. Drug use. Emotional well-being. Home and relationship well-being. Sexual activity. Eating habits. History of falls. Memory and ability to understand (cognition). Work and work Statistician. Screening  You may have the following tests or measurements: Height, weight, and BMI. Blood pressure. Lipid and cholesterol levels. These may be checked every 5 years, or more frequently if you are over 4 years old. Skin check. Lung cancer screening. You may have this screening every year starting at age 74 if you have a 30-pack-year history of smoking and currently smoke or have quit within the past 15 years. Fecal occult blood test (FOBT) of the stool. You may have this test every year starting at age 74. Flexible sigmoidoscopy or colonoscopy. You may have a sigmoidoscopy every 5 years or a colonoscopy every 10 years starting at age 74. Prostate cancer screening. Recommendations will vary depending on your family history and other risks. Hepatitis C blood test. Hepatitis B blood test. Sexually transmitted disease (STD) testing. Diabetes screening. This is done by checking your blood sugar (glucose) after you have not eaten for a while (fasting). You may have this done every 1-3 years. Abdominal aortic aneurysm (AAA) screening. You may need this if you are a current or former smoker. Osteoporosis. You may be screened starting at age 74 if you are at high risk. Talk with your health care provider about your test results, treatment options, and if necessary, the need for more tests. Vaccines  Your  health care provider may recommend certain vaccines, such as: Influenza vaccine. This is recommended every year. Tetanus, diphtheria, and acellular  pertussis (Tdap, Td) vaccine. You may need a Td booster every 10 years. Zoster vaccine. You may need this after age 40. Pneumococcal 13-valent conjugate (PCV13) vaccine. One dose is recommended after age 74. Pneumococcal polysaccharide (PPSV23) vaccine. One dose is recommended after age 74. Talk to your health care provider about which screenings and vaccines you need and how often you need them. This information is not intended to replace advice given to you by your health care provider. Make sure you discuss any questions you have with your health care provider. Document Released: 08/20/2015 Document Revised: 04/12/2016 Document Reviewed: 05/25/2015 Elsevier Interactive Patient Education  2017 [employer] Prevention in the Home Falls can cause injuries. They can happen to people of all ages. There are many things you can do to make your home safe and to help prevent falls. What can I do on the outside of my home? Regularly fix the edges of walkways and driveways and fix any cracks. Remove anything that might make you trip as you walk through a door, such as a raised step or threshold. Trim any bushes or trees on the path to your home. Use bright outdoor lighting. Clear any walking paths of anything that might make someone trip, such as rocks or tools. Regularly check to see if handrails are loose or broken. Make sure that both sides of any steps have handrails. Any raised decks and porches should have guardrails on the edges. Have any leaves, snow, or ice cleared regularly. Use sand or salt on walking paths during winter. Clean up any spills in your garage right away. This includes oil or grease spills. What can I do in the bathroom? Use night lights. Install grab bars by the toilet and in the tub and shower. Do not use towel bars as grab bars. Use non-skid mats or decals in the tub or shower. If you need to sit down in the shower, use a plastic, non-slip stool. Keep the floor  dry. Clean up any water that spills on the floor as soon as it happens. Remove soap buildup in the tub or shower regularly. Attach bath mats securely with double-sided non-slip rug tape. Do not have throw rugs and other things on the floor that can make you trip. What can I do in the bedroom? Use night lights. Make sure that you have a light by your bed that is easy to reach. Do not use any sheets or blankets that are too big for your bed. They should not hang down onto the floor. Have a firm chair that has side arms. You can use this for support while you get dressed. Do not have throw rugs and other things on the floor that can make you trip. What can I do in the kitchen? Clean up any spills right away. Avoid walking on wet floors. Keep items that you use a lot in easy-to-reach places. If you need to reach something above you, use a strong step stool that has a grab bar. Keep electrical cords out of the way. Do not use floor polish or wax that makes floors slippery. If you must use wax, use non-skid floor wax. Do not have throw rugs and other things on the floor that can make you trip. What can I do with my stairs? Do not leave any items on the stairs. Make sure that there are handrails  on both sides of the stairs and use them. Fix handrails that are broken or loose. Make sure that handrails are as long as the stairways. Check any carpeting to make sure that it is firmly attached to the stairs. Fix any carpet that is loose or worn. Avoid having throw rugs at the top or bottom of the stairs. If you do have throw rugs, attach them to the floor with carpet tape. Make sure that you have a light switch at the top of the stairs and the bottom of the stairs. If you do not have them, ask someone to add them for you. What else can I do to help prevent falls? Wear shoes that: Do not have high heels. Have rubber bottoms. Are comfortable and fit you well. Are closed at the toe. Do not wear  sandals. If you use a stepladder: Make sure that it is fully opened. Do not climb a closed stepladder. Make sure that both sides of the stepladder are locked into place. Ask someone to hold it for you, if possible. Clearly mark and make sure that you can see: Any grab bars or handrails. First and last steps. Where the edge of each step is. Use tools that help you move around (mobility aids) if they are needed. These include: Canes. Walkers. Scooters. Crutches. Turn on the lights when you go into a dark area. Replace any light bulbs as soon as they burn out. Set up your furniture so you have a clear path. Avoid moving your furniture around. If any of your floors are uneven, fix them. If there are any pets around you, be aware of where they are. Review your medicines with your doctor. Some medicines can make you feel dizzy. This can increase your chance of falling. Ask your doctor what other things that you can do to help prevent falls. This information is not intended to replace advice given to you by your health care provider. Make sure you discuss any questions you have with your health care provider. Document Released: 05/20/2009 Document Revised: 12/30/2015 Document Reviewed: 08/28/2014 Elsevier Interactive Patient Education  2017 [employer].

## 2023-04-25 ENCOUNTER — Ambulatory Visit: Payer: Medicare Other | Admitting: Physical Therapy

## 2023-04-26 ENCOUNTER — Ambulatory Visit: Payer: Medicare Other | Attending: Student

## 2023-04-26 ENCOUNTER — Other Ambulatory Visit: Payer: Self-pay

## 2023-04-26 DIAGNOSIS — M25561 Pain in right knee: Secondary | ICD-10-CM | POA: Diagnosis present

## 2023-04-26 DIAGNOSIS — R600 Localized edema: Secondary | ICD-10-CM | POA: Insufficient documentation

## 2023-04-26 DIAGNOSIS — M25661 Stiffness of right knee, not elsewhere classified: Secondary | ICD-10-CM | POA: Insufficient documentation

## 2023-04-26 NOTE — Therapy (Signed)
OUTPATIENT PHYSICAL THERAPY LOWER EXTREMITY EVALUATION   Patient Name: Grant Richardson. MRN: [id number] DOB:10/29/1945, 77 y.o., male Today's Date: 04/26/2023  END OF SESSION:  PT End of Session - 04/26/23 1434     Visit Number 1    Number of Visits 8    Date for PT Re-Evaluation 07/06/23    PT Start Time 1435    PT Stop Time 1509    PT Time Calculation (min) 34 min    Activity Tolerance Patient tolerated treatment well    Behavior During Therapy WFL for tasks assessed/performed             Past Medical History:  Diagnosis Date   Diabetes mellitus without complication (HCC)    Hyperlipidemia    Self-catheterizes urinary bladder    Spina bifida (HCC)    Past Surgical History:  Procedure Laterality Date   KNEE ARTHROSCOPY Right    Patient Active Problem List   Diagnosis Date Noted   Decubitus ulcer of left perineal ischial region, stage 2 (HCC) 11/21/2016   Spina bifida (HCC) 11/21/2016   Recurrent UTI 06/09/2016   Unilateral primary osteoarthritis, left knee 05/18/2016   Primary osteoarthritis of both knees 01/27/2016   Liver hemangioma 07/15/2013   Neurogenic bladder 10/10/2011   Tethered spinal cord (HCC) 10/10/2011   Hypertension 10/09/2011   Type 1 diabetes mellitus (HCC) 10/09/2011   HLD (hyperlipidemia) 05/18/2011    PCP: Rebekah Chesterfield, NP  REFERRING PROVIDER: Levonne Lapping, MD   REFERRING DIAG: Aftercare following joint replacement surgery; Presence of right artificial knee joint   THERAPY DIAG:  Stiffness of right knee, not elsewhere classified  Acute pain of right knee  Localized edema  Rationale for Evaluation and Treatment: Rehabilitation  ONSET DATE: 04/12/23  SUBJECTIVE:   SUBJECTIVE STATEMENT: Patient reports that he had a right knee replacement on 04/12/23. He notes that he had home health physical therapy for 3 visits. He feels that his knee mainly aches and is sore across the front of his knee. He has been doing heel slides,  quad sets, squats, and ankle pumps twice per day.   PERTINENT HISTORY: Hypertension, diabetes (type 1), spina bifida, and osteoarthritis PAIN:  Are you having pain? Yes: NPRS scale: 3/10 Pain location: right anterior knee Pain description: constant dull pain with some ache and soreness Aggravating factors: exercise Relieving factors: ice, medication, and compression  PRECAUTIONS: None  RED FLAGS: None   WEIGHT BEARING RESTRICTIONS: No  FALLS:  Has patient fallen in last 6 months? No  LIVING ENVIRONMENT: Lives with: lives with their spouse Lives in: House/apartment Stairs: Yes: External: 6 steps; can reach both; step to pattern Has following equipment at home: Single point cane  OCCUPATION: retired   PLOF: Independent  PATIENT GOALS: be able to get back in his shop (be able to lift up to 40-50 pounds), yard work, and be able to walk without a cane  NEXT MD VISIT: 05/28/23  OBJECTIVE:   PATIENT SURVEYS:  FOTO 62.13  COGNITION: Overall cognitive status: Within functional limits for tasks assessed     SENSATION: Patient reports no numbness or tingling  EDEMA:  Circumferential: Left tibiofemoral joint line: 40.3 cm Right tibiofemoral joint line: 36.5 cm   PALPATION: TTP: right quadriceps, medial and lateral joint line, popliteal fossa, and patella   LOWER EXTREMITY ROM:  Active ROM Right eval Left eval  Hip flexion    Hip extension    Hip abduction    Hip adduction    Hip  internal rotation    Hip external rotation    Knee flexion 105/108 (PROM) 122  Knee extension 8 5  Ankle dorsiflexion    Ankle plantarflexion    Ankle inversion    Ankle eversion     (Blank rows = not tested)  LOWER EXTREMITY MMT: not tested due to surgical condition  GAIT: Assistive device utilized: Single point cane Level of assistance: Modified independence Comments: decreased gait speed and stride length with right knee flexed in stance phase   TODAY'S TREATMENT:                                                                                                                               DATE:     PATIENT EDUCATION:  Education details: POC, prognosis, objective findings, HEP, and goals for therapy Person educated: Patient Education method: Explanation Education comprehension: verbalized understanding  HOME EXERCISE PROGRAM:   ASSESSMENT:  CLINICAL IMPRESSION: Patient is a 77 y.o. male who was seen today for physical therapy evaluation and treatment following a right total knee arthroplasty on 04/12/23. He presented with low pain severity and irritability with none of today's assessments significantly reproducing his familiar symptoms. He exhibited increase right knee edema, but no other signs or symptoms of a postoperative complication. Recommend that he continue with skilled physical therapy to address his impairments to return to his prior level of function.   OBJECTIVE IMPAIRMENTS: Abnormal gait, decreased activity tolerance, decreased ROM, decreased strength, increased edema, and pain.   ACTIVITY LIMITATIONS: lifting, standing, squatting, sleeping, stairs, transfers, and locomotion level  PARTICIPATION LIMITATIONS: driving, shopping, community activity, and yard work  PERSONAL FACTORS: 3+ comorbidities: Hypertension, diabetes (type 1), spina bifida, and osteoarthritis  are also affecting patient's functional outcome.   REHAB POTENTIAL: Excellent  CLINICAL DECISION MAKING: Stable/uncomplicated  EVALUATION COMPLEXITY: Low   GOALS: Goals reviewed with patient? Yes  LONG TERM GOALS: Target date: 05/24/23  Patient will be independent with his HEP.  Baseline:  Goal status: INITIAL  2.  Patient will be able to demonstrate at least 120 degrees of right knee flexion for improved function navigating stairs.  Baseline:  Goal status: INITIAL  3.  Patient will improve his right knee extension within 5 degrees of neutral for improved gait mechanics.   Baseline:  Goal status: INITIAL  4.  Patient will be able to navigate at least 4 steps with a reciprocal pattern for improved household mobility.  Baseline:  Goal status: INITIAL  5.  Patient will be able to ambulate at least 80 feet without an assistive and no significant gait deviations for improved household mobility.  Baseline:  Goal status: INITIAL  PLAN:  PT FREQUENCY: 2x/week  PT DURATION: 4 weeks  PLANNED INTERVENTIONS: Therapeutic exercises, Therapeutic activity, Neuromuscular re-education, Balance training, Gait training, Patient/Family education, Self Care, Joint mobilization, Stair training, Electrical stimulation, Cryotherapy, Moist heat, Vasopneumatic device, Manual therapy, and Re-evaluation  PLAN FOR NEXT SESSION: recumbent bike, squatting, lunges, lower  extremity strengthening, gait training, manual therapy, and modalities as needed   Granville Lewis, PT 04/26/2023, 6:43 PM

## 2023-05-01 ENCOUNTER — Ambulatory Visit: Payer: Medicare Other | Admitting: Physical Therapy

## 2023-05-01 DIAGNOSIS — M25561 Pain in right knee: Secondary | ICD-10-CM

## 2023-05-01 DIAGNOSIS — R600 Localized edema: Secondary | ICD-10-CM

## 2023-05-01 DIAGNOSIS — M25661 Stiffness of right knee, not elsewhere classified: Secondary | ICD-10-CM

## 2023-05-01 NOTE — Therapy (Signed)
OUTPATIENT PHYSICAL THERAPY LOWER EXTREMITY EVALUATION   Patient Name: Grant Richardson. MRN: [id number] DOB:[birth year], 77 y.o., male Today's Date: 05/01/2023  END OF SESSION:  PT End of Session - 05/01/23 1417     Visit Number 2    Number of Visits 8    Date for PT Re-Evaluation 07/06/23    PT Start Time 0213    PT Stop Time 0301    PT Time Calculation (min) 48 min    Activity Tolerance Patient tolerated treatment well    Behavior During Therapy [facility] for tasks assessed/performed             Past Medical History:  Diagnosis Date   Diabetes mellitus without complication (HCC)    Hyperlipidemia    Self-catheterizes urinary bladder    Spina bifida (HCC)    Past Surgical History:  Procedure Laterality Date   KNEE ARTHROSCOPY Right    Patient Active Problem List   Diagnosis Date Noted   Decubitus ulcer of left perineal ischial region, stage 2 (HCC) 11/21/2016   Spina bifida (HCC) 11/21/2016   Recurrent UTI 06/09/2016   Unilateral primary osteoarthritis, left knee 05/18/2016   Primary osteoarthritis of both knees 01/27/2016   Liver hemangioma 07/15/2013   Neurogenic bladder 10/10/2011   Tethered spinal cord (HCC) 10/10/2011   Hypertension 10/09/2011   Type 1 diabetes mellitus (HCC) 10/09/2011   HLD (hyperlipidemia) 05/18/2011    PCP: Rebekah Chesterfield, NP  REFERRING PROVIDER: Levonne Lapping, MD   REFERRING DIAG: Aftercare following joint replacement surgery; Presence of right artificial knee joint   THERAPY DIAG:  Stiffness of right knee, not elsewhere classified  Acute pain of right knee  Localized edema  Rationale for Evaluation and Treatment: Rehabilitation  ONSET DATE: 04/12/23  SUBJECTIVE:   SUBJECTIVE STATEMENT: Patient doing well.  Not reporting pain today.  PERTINENT HISTORY: Hypertension, diabetes (type 1), spina bifida, and osteoarthritis PAIN:  Are you having pain? Yes: NPRS scale: 0/10 Pain location: right anterior knee Pain  description: constant dull pain with some ache and soreness Aggravating factors: exercise Relieving factors: ice, medication, and compression  PRECAUTIONS: None  RED FLAGS: None   WEIGHT BEARING RESTRICTIONS: No  FALLS:  Has patient fallen in last 6 months? No  LIVING ENVIRONMENT: Lives with: lives with their spouse Lives in: House/apartment Stairs: Yes: External: 6 steps; can reach both; step to pattern Has following equipment at home: Single point cane  OCCUPATION: retired   PLOF: Independent  PATIENT GOALS: be able to get back in his shop (be able to lift up to 40-50 pounds), yard work, and be able to walk without a cane  NEXT MD VISIT: 05/28/23  OBJECTIVE:   PATIENT SURVEYS:  FOTO 62.13  COGNITION: Overall cognitive status: Within functional limits for tasks assessed     SENSATION: Patient reports no numbness or tingling  EDEMA:  Circumferential: Left tibiofemoral joint line: 40.3 cm Right tibiofemoral joint line: 36.5 cm   PALPATION: TTP: right quadriceps, medial and lateral joint line, popliteal fossa, and patella   LOWER EXTREMITY ROM:  Active ROM Right eval Left eval Right 05/01/23  Hip flexion     Hip extension     Hip abduction     Hip adduction     Hip internal rotation     Hip external rotation     Knee flexion 105/108 (PROM) 122 120 post-stretching  Knee extension 8 5   Ankle dorsiflexion     Ankle plantarflexion     Ankle inversion  Ankle eversion      (Blank rows = not tested)  LOWER EXTREMITY MMT: not tested due to surgical condition  GAIT: Assistive device utilized: Single point cane Level of assistance: Modified independence Comments: decreased gait speed and stride length with right knee flexed in stance phase   TODAY'S TREATMENT:                                                                                                                              DATE:    05/01/23:                                   EXERCISE  LOG  Exercise Repetitions and Resistance Comments  Nustep Level 3 x 15 minutes moving seat forward x 2 to increase flexion.   Rockerboard in parallel bars 3 minutes.   In supine:  Gentle low load passive stretching to patient's right knee into flexion and extension x 8 minutes f/b LE elevation and vasopneumatic x 15 minutes.   PATIENT EDUCATION:  Education details: POC, prognosis, objective findings, HEP, and goals for therapy Person educated: Patient Education method: Explanation Education comprehension: verbalized understanding  HOME EXERCISE PROGRAM:   ASSESSMENT:  CLINICAL IMPRESSION: The patient is very motivated and did well with treatment today.  Post-stretching he achieved passive right knee flexion to 120 degrees.  OBJECTIVE IMPAIRMENTS: Abnormal gait, decreased activity tolerance, decreased ROM, decreased strength, increased edema, and pain.   ACTIVITY LIMITATIONS: lifting, standing, squatting, sleeping, stairs, transfers, and locomotion level  PARTICIPATION LIMITATIONS: driving, shopping, community activity, and yard work  PERSONAL FACTORS: 3+ comorbidities: Hypertension, diabetes (type 1), spina bifida, and osteoarthritis  are also affecting patient's functional outcome.   REHAB POTENTIAL: Excellent  CLINICAL DECISION MAKING: Stable/uncomplicated  EVALUATION COMPLEXITY: Low   GOALS: Goals reviewed with patient? Yes  LONG TERM GOALS: Target date: 05/24/23  Patient will be independent with his HEP.  Baseline:  Goal status: INITIAL  2.  Patient will be able to demonstrate at least 120 degrees of right knee flexion for improved function navigating stairs.  Baseline:  Goal status: INITIAL  3.  Patient will improve his right knee extension within 5 degrees of neutral for improved gait mechanics.  Baseline:  Goal status: INITIAL  4.  Patient will be able to navigate at least 4 steps with a reciprocal pattern for improved household mobility.  Baseline:   Goal status: INITIAL  5.  Patient will be able to ambulate at least 80 feet without an assistive and no significant gait deviations for improved household mobility.  Baseline:  Goal status: INITIAL  PLAN:  PT FREQUENCY: 2x/week  PT DURATION: 4 weeks  PLANNED INTERVENTIONS: Therapeutic exercises, Therapeutic activity, Neuromuscular re-education, Balance training, Gait training, Patient/Family education, Self Care, Joint mobilization, Stair training, Electrical stimulation, Cryotherapy, Moist heat, Vasopneumatic device, Manual therapy, and Re-evaluation  PLAN FOR NEXT SESSION: recumbent bike,  squatting, lunges, lower extremity strengthening, gait training, manual therapy, and modalities as needed   Garold Sheeler, [place], PT 05/01/2023, 3:09 PM

## 2023-05-03 ENCOUNTER — Ambulatory Visit: Payer: Medicare Other | Admitting: Physical Therapy

## 2023-05-03 DIAGNOSIS — M25661 Stiffness of right knee, not elsewhere classified: Secondary | ICD-10-CM | POA: Diagnosis not present

## 2023-05-03 DIAGNOSIS — R600 Localized edema: Secondary | ICD-10-CM

## 2023-05-03 DIAGNOSIS — M25561 Pain in right knee: Secondary | ICD-10-CM

## 2023-05-03 NOTE — Therapy (Signed)
OUTPATIENT PHYSICAL THERAPY LOWER EXTREMITY EVALUATION   Patient Name: Grant Richardson. MRN: [id number] DOB:11/14/1945, 77 y.o., male Today's Date: 05/03/2023  END OF SESSION:  PT End of Session - 05/03/23 1433     Visit Number 3    Number of Visits 8    Date for PT Re-Evaluation 07/06/23    PT Start Time 0230    PT Stop Time 0320    PT Time Calculation (min) 50 min             Past Medical History:  Diagnosis Date   Diabetes mellitus without complication (HCC)    Hyperlipidemia    Self-catheterizes urinary bladder    Spina bifida (HCC)    Past Surgical History:  Procedure Laterality Date   KNEE ARTHROSCOPY Right    Patient Active Problem List   Diagnosis Date Noted   Decubitus ulcer of left perineal ischial region, stage 2 (HCC) 11/21/2016   Spina bifida (HCC) 11/21/2016   Recurrent UTI 06/09/2016   Unilateral primary osteoarthritis, left knee 05/18/2016   Primary osteoarthritis of both knees 01/27/2016   Liver hemangioma 07/15/2013   Neurogenic bladder 10/10/2011   Tethered spinal cord (HCC) 10/10/2011   Hypertension 10/09/2011   Type 1 diabetes mellitus (HCC) 10/09/2011   HLD (hyperlipidemia) 05/18/2011    PCP: Rebekah Chesterfield, NP  REFERRING PROVIDER: Levonne Lapping, MD   REFERRING DIAG: Aftercare following joint replacement surgery; Presence of right artificial knee joint   THERAPY DIAG:  Stiffness of right knee, not elsewhere classified  Acute pain of right knee  Localized edema  Rationale for Evaluation and Treatment: Rehabilitation  ONSET DATE: 04/12/23  SUBJECTIVE:   SUBJECTIVE STATEMENT: Work in yard for about a hour increased pain some but good now. PERTINENT HISTORY: Hypertension, diabetes (type 1), spina bifida, and osteoarthritis PAIN:  Are you having pain? Yes: NPRS scale:  /10 Pain location: right anterior knee Pain description: constant dull pain with some ache and soreness Aggravating factors: exercise Relieving  factors: ice, medication, and compression  PRECAUTIONS: None  RED FLAGS: None   WEIGHT BEARING RESTRICTIONS: No  FALLS:  Has patient fallen in last 6 months? No  LIVING ENVIRONMENT: Lives with: lives with their spouse Lives in: House/apartment Stairs: Yes: External: 6 steps; can reach both; step to pattern Has following equipment at home: Single point cane  OCCUPATION: retired   PLOF: Independent  PATIENT GOALS: be able to get back in his shop (be able to lift up to 40-50 pounds), yard work, and be able to walk without a cane  NEXT MD VISIT: 05/28/23  OBJECTIVE:   PATIENT SURVEYS:  FOTO 62.13  COGNITION: Overall cognitive status: Within functional limits for tasks assessed     SENSATION: Patient reports no numbness or tingling  EDEMA:  Circumferential: Left tibiofemoral joint line: 40.3 cm Right tibiofemoral joint line: 36.5 cm   PALPATION: TTP: right quadriceps, medial and lateral joint line, popliteal fossa, and patella   LOWER EXTREMITY ROM:  Active ROM Right eval Left eval Right 05/01/23  Hip flexion     Hip extension     Hip abduction     Hip adduction     Hip internal rotation     Hip external rotation     Knee flexion 105/108 (PROM) 122 120 post-stretching  Knee extension 8 5   Ankle dorsiflexion     Ankle plantarflexion     Ankle inversion     Ankle eversion      (Blank rows =  not tested)  LOWER EXTREMITY MMT: not tested due to surgical condition  GAIT: Assistive device utilized: Single point cane Level of assistance: Modified independence Comments: decreased gait speed and stride length with right knee flexed in stance phase   TODAY'S TREATMENT:                                                                                                                              DATE:    05/03/23:                                     EXERCISE LOG  Exercise Repetitions and Resistance Comments  Nustep  Level 4 x 5 minutes   Recumbent bike Seat  6 and 5 x 10 minutes at level 3.   Rockerboard  In parallel bars x 3 minutes           In supine:  Gentle low load passive stretching to patient's right knee into flexion and extension x 7 minutes f/b LE elevation and vasopneumatic x 15 minutes.  05/01/23:                                   EXERCISE LOG  Exercise Repetitions and Resistance Comments  Nustep Level 3 x 15 minutes moving seat forward x 2 to increase flexion.   Rockerboard in parallel bars 3 minutes.   In supine:  Gentle low load passive stretching to patient's right knee into flexion and extension x 8 minutes f/b LE elevation and vasopneumatic x 15 minutes.   PATIENT EDUCATION:  Education details: See below. Person educated: Patient Education method: Explanation, demo Education comprehension: verbalized understanding, handout  HOME EXERCISE PROGRAM: HOME EXERCISE PROGRAM Created by [place] Darcie Mellone Sep 26th, 2024 View at [email or web address] using code: [email or web address]  Page 1 of 1 1 Exercise PRONE QUAD STRETCH WITH BELT OR STRAP Start by lying on your stomach with a strap or 2 belts linked together and looped it around your affected side ankle. Next, use the belt to pull the knee into a bent position allowing for a stretch as shown. Repeat 5 Times Hold 1 Minute Complete 1 Set Perform 3 Times a Day  ASSESSMENT:  CLINICAL IMPRESSION: The patient did very well and progressed to the recumbent bike today.  Instructed patient in a prone sheet stretch to increase flexion.  OBJECTIVE IMPAIRMENTS: Abnormal gait, decreased activity tolerance, decreased ROM, decreased strength, increased edema, and pain.   ACTIVITY LIMITATIONS: lifting, standing, squatting, sleeping, stairs, transfers, and locomotion level  PARTICIPATION LIMITATIONS: driving, shopping, community activity, and yard work  PERSONAL FACTORS: 3+ comorbidities: Hypertension, diabetes (type 1), spina bifida, and osteoarthritis  are also affecting patient's  functional outcome.   REHAB POTENTIAL: Excellent  CLINICAL DECISION MAKING: Stable/uncomplicated  EVALUATION COMPLEXITY: Low   GOALS: Goals reviewed with  patient? Yes  LONG TERM GOALS: Target date: 05/24/23  Patient will be independent with his HEP.  Baseline:  Goal status: INITIAL  2.  Patient will be able to demonstrate at least 120 degrees of right knee flexion for improved function navigating stairs.  Baseline:  Goal status: INITIAL  3.  Patient will improve his right knee extension within 5 degrees of neutral for improved gait mechanics.  Baseline:  Goal status: INITIAL  4.  Patient will be able to navigate at least 4 steps with a reciprocal pattern for improved household mobility.  Baseline:  Goal status: INITIAL  5.  Patient will be able to ambulate at least 80 feet without an assistive and no significant gait deviations for improved household mobility.  Baseline:  Goal status: INITIAL  PLAN:  PT FREQUENCY: 2x/week  PT DURATION: 4 weeks  PLANNED INTERVENTIONS: Therapeutic exercises, Therapeutic activity, Neuromuscular re-education, Balance training, Gait training, Patient/Family education, Self Care, Joint mobilization, Stair training, Electrical stimulation, Cryotherapy, Moist heat, Vasopneumatic device, Manual therapy, and Re-evaluation  PLAN FOR NEXT SESSION: recumbent bike, squatting, lunges, lower extremity strengthening, gait training, manual therapy, and modalities as needed   Elke Holtry, [place], PT 05/03/2023, 3:35 PM

## 2023-05-08 ENCOUNTER — Ambulatory Visit: Payer: Medicare Other | Attending: Student | Admitting: Physical Therapy

## 2023-05-08 DIAGNOSIS — M25561 Pain in right knee: Secondary | ICD-10-CM | POA: Diagnosis present

## 2023-05-08 DIAGNOSIS — M25661 Stiffness of right knee, not elsewhere classified: Secondary | ICD-10-CM | POA: Diagnosis present

## 2023-05-08 DIAGNOSIS — R600 Localized edema: Secondary | ICD-10-CM | POA: Insufficient documentation

## 2023-05-08 NOTE — Therapy (Signed)
OUTPATIENT PHYSICAL THERAPY LOWER EXTREMITY EVALUATION   Patient Name: Grant Richardson. MRN: [id number] DOB:[birth year], 77 y.o., male Today's Date: 05/08/2023  END OF SESSION:  PT End of Session - 05/08/23 1506     Visit Number 4    Number of Visits 8    Date for PT Re-Evaluation 07/06/23    PT Start Time 0230    PT Stop Time 0321    PT Time Calculation (min) 51 min    Activity Tolerance Patient tolerated treatment well    Behavior During Therapy WFL for tasks assessed/performed             Past Medical History:  Diagnosis Date   Diabetes mellitus without complication (HCC)    Hyperlipidemia    Self-catheterizes urinary bladder    Spina bifida (HCC)    Past Surgical History:  Procedure Laterality Date   KNEE ARTHROSCOPY Right    Patient Active Problem List   Diagnosis Date Noted   Decubitus ulcer of left perineal ischial region, stage 2 (HCC) 11/21/2016   Spina bifida (HCC) 11/21/2016   Recurrent UTI 06/09/2016   Unilateral primary osteoarthritis, left knee 05/18/2016   Primary osteoarthritis of both knees 01/27/2016   Liver hemangioma 07/15/2013   Neurogenic bladder 10/10/2011   Tethered spinal cord (HCC) 10/10/2011   Hypertension 10/09/2011   Type 1 diabetes mellitus (HCC) 10/09/2011   HLD (hyperlipidemia) 05/18/2011    PCP: Rebekah Chesterfield, NP  REFERRING PROVIDER: Levonne Lapping, MD   REFERRING DIAG: Aftercare following joint replacement surgery; Presence of right artificial knee joint   THERAPY DIAG:  Stiffness of right knee, not elsewhere classified  Acute pain of right knee  Localized edema  Rationale for Evaluation and Treatment: Rehabilitation  ONSET DATE: 04/12/23  SUBJECTIVE:   SUBJECTIVE STATEMENT: Work in yard for about a hour increased pain some but good now. PERTINENT HISTORY: Hypertension, diabetes (type 1), spina bifida, and osteoarthritis PAIN:  Are you having pain? Yes: NPRS scale:  /10 Pain location: right anterior  knee Pain description: constant dull pain with some ache and soreness Aggravating factors: exercise Relieving factors: ice, medication, and compression  PRECAUTIONS: None  RED FLAGS: None   WEIGHT BEARING RESTRICTIONS: No  FALLS:  Has patient fallen in last 6 months? No  LIVING ENVIRONMENT: Lives with: lives with their spouse Lives in: House/apartment Stairs: Yes: External: 6 steps; can reach both; step to pattern Has following equipment at home: Single point cane  OCCUPATION: retired   PLOF: Independent  PATIENT GOALS: be able to get back in his shop (be able to lift up to 40-50 pounds), yard work, and be able to walk without a cane  NEXT MD VISIT: 05/28/23  OBJECTIVE:   PATIENT SURVEYS:  FOTO 62.13  COGNITION: Overall cognitive status: Within functional limits for tasks assessed     SENSATION: Patient reports no numbness or tingling  EDEMA:  Circumferential: Left tibiofemoral joint line: 40.3 cm Right tibiofemoral joint line: 36.5 cm   PALPATION: TTP: right quadriceps, medial and lateral joint line, popliteal fossa, and patella   LOWER EXTREMITY ROM:  Active ROM Right eval Left eval Right 05/01/23  Hip flexion     Hip extension     Hip abduction     Hip adduction     Hip internal rotation     Hip external rotation     Knee flexion 105/108 (PROM) 122 120 post-stretching  Knee extension 8 5   Ankle dorsiflexion     Ankle plantarflexion  Ankle inversion     Ankle eversion      (Blank rows = not tested)  LOWER EXTREMITY MMT: not tested due to surgical condition  GAIT: Assistive device utilized: Single point cane Level of assistance: Modified independence Comments: decreased gait speed and stride length with right knee flexed in stance phase   TODAY'S TREATMENT:                                                                                                                              DATE:    05/08/23:                                     EXERCISE LOG  Exercise Repetitions and Resistance Comments  Recumbent bike Level 3 x 15 minutes starting a seat 6 and ending 4.   Knee ext 10# x 3 minutes   Ham curls 30# x 3 minutes   Leg Press 2 plates x 3 minutes       LE elevation and vasopneumatic to patient's right knee x 20 minutes.    05/03/23:                                     EXERCISE LOG  Exercise Repetitions and Resistance Comments  Nustep  Level 4 x 5 minutes   Recumbent bike Seat 6 and 5 x 10 minutes at level 3.   Rockerboard  In parallel bars x 3 minutes           In supine:  Gentle low load passive stretching to patient's right knee into flexion and extension x 7 minutes f/b LE elevation and vasopneumatic x 15 minutes.  05/01/23:                                   EXERCISE LOG  Exercise Repetitions and Resistance Comments  Nustep Level 3 x 15 minutes moving seat forward x 2 to increase flexion.   Rockerboard in parallel bars 3 minutes.   In supine:  Gentle low load passive stretching to patient's right knee into flexion and extension x 8 minutes f/b LE elevation and vasopneumatic x 15 minutes.   PATIENT EDUCATION:  Education details: See below. Person educated: Patient Education method: Explanation, demo Education comprehension: verbalized understanding, handout  HOME EXERCISE PROGRAM: HOME EXERCISE PROGRAM Created by [place] Troy Kanouse Sep 26th, 2024 View at [email or web address] using code: [email or web address]  Page 1 of 1 1 Exercise PRONE QUAD STRETCH WITH BELT OR STRAP Start by lying on your stomach with a strap or 2 belts linked together and looped it around your affected side ankle. Next, use the belt to pull the knee into a bent position allowing for a stretch as shown. Repeat 5  Times Hold 1 Minute Complete 1 Set Perform 3 Times a Day  ASSESSMENT:  CLINICAL IMPRESSION: The patient did very well with the addition of weight machines today and he performed the exercises with excellent technique and without  complaint.  He has palpable pain at the superior portion of his incision which appear to be related to scar tissue.    OBJECTIVE IMPAIRMENTS: Abnormal gait, decreased activity tolerance, decreased ROM, decreased strength, increased edema, and pain.   ACTIVITY LIMITATIONS: lifting, standing, squatting, sleeping, stairs, transfers, and locomotion level  PARTICIPATION LIMITATIONS: driving, shopping, community activity, and yard work  PERSONAL FACTORS: 3+ comorbidities: Hypertension, diabetes (type 1), spina bifida, and osteoarthritis  are also affecting patient's functional outcome.   REHAB POTENTIAL: Excellent  CLINICAL DECISION MAKING: Stable/uncomplicated  EVALUATION COMPLEXITY: Low   GOALS: Goals reviewed with patient? Yes  LONG TERM GOALS: Target date: 05/24/23  Patient will be independent with his HEP.  Baseline:  Goal status: INITIAL  2.  Patient will be able to demonstrate at least 120 degrees of right knee flexion for improved function navigating stairs.  Baseline:  Goal status: INITIAL  3.  Patient will improve his right knee extension within 5 degrees of neutral for improved gait mechanics.  Baseline:  Goal status: INITIAL  4.  Patient will be able to navigate at least 4 steps with a reciprocal pattern for improved household mobility.  Baseline:  Goal status: INITIAL  5.  Patient will be able to ambulate at least 80 feet without an assistive and no significant gait deviations for improved household mobility.  Baseline:  Goal status: INITIAL  PLAN:  PT FREQUENCY: 2x/week  PT DURATION: 4 weeks  PLANNED INTERVENTIONS: Therapeutic exercises, Therapeutic activity, Neuromuscular re-education, Balance training, Gait training, Patient/Family education, Self Care, Joint mobilization, Stair training, Electrical stimulation, Cryotherapy, Moist heat, Vasopneumatic device, Manual therapy, and Re-evaluation  PLAN FOR NEXT SESSION: recumbent bike, squatting, lunges, lower  extremity strengthening, gait training, manual therapy, and modalities as needed   Leasa Kincannon, [place], PT 05/08/2023, 3:28 PM

## 2023-05-10 ENCOUNTER — Ambulatory Visit: Payer: Medicare Other | Admitting: Physical Therapy

## 2023-05-10 DIAGNOSIS — M25661 Stiffness of right knee, not elsewhere classified: Secondary | ICD-10-CM

## 2023-05-10 DIAGNOSIS — R600 Localized edema: Secondary | ICD-10-CM

## 2023-05-10 DIAGNOSIS — M25561 Pain in right knee: Secondary | ICD-10-CM

## 2023-05-10 NOTE — Therapy (Signed)
OUTPATIENT PHYSICAL THERAPY LOWER EXTREMITY EVALUATION   Patient Name: Grant Richardson. MRN: [id number] DOB:28-Nov-1945, 77 y.o., male Today's Date: 05/10/2023  END OF SESSION:  PT End of Session - 05/10/23 1400     Visit Number 5    Number of Visits 8    Date for PT Re-Evaluation 07/06/23    PT Start Time 0145    PT Stop Time 0237    PT Time Calculation (min) 52 min    Activity Tolerance Patient tolerated treatment well    Behavior During Therapy [facility] for tasks assessed/performed             Past Medical History:  Diagnosis Date   Diabetes mellitus without complication (HCC)    Hyperlipidemia    Self-catheterizes urinary bladder    Spina bifida (HCC)    Past Surgical History:  Procedure Laterality Date   KNEE ARTHROSCOPY Right    Patient Active Problem List   Diagnosis Date Noted   Decubitus ulcer of left perineal ischial region, stage 2 (HCC) 11/21/2016   Spina bifida (HCC) 11/21/2016   Recurrent UTI 06/09/2016   Unilateral primary osteoarthritis, left knee 05/18/2016   Primary osteoarthritis of both knees 01/27/2016   Liver hemangioma 07/15/2013   Neurogenic bladder 10/10/2011   Tethered spinal cord (HCC) 10/10/2011   Hypertension 10/09/2011   Type 1 diabetes mellitus (HCC) 10/09/2011   HLD (hyperlipidemia) 05/18/2011    PCP: Rebekah Chesterfield, NP  REFERRING PROVIDER: Levonne Lapping, MD   REFERRING DIAG: Aftercare following joint replacement surgery; Presence of right artificial knee joint   THERAPY DIAG:  Stiffness of right knee, not elsewhere classified  Acute pain of right knee  Localized edema  Rationale for Evaluation and Treatment: Rehabilitation  ONSET DATE: 04/12/23  SUBJECTIVE:   SUBJECTIVE STATEMENT:  Hurts a top of incision.  PERTINENT HISTORY: Hypertension, diabetes (type 1), spina bifida, and osteoarthritis PAIN:  Are you having pain? Yes: NPRS scale: Varies/10 Pain location: right anterior knee Pain description: constant  dull pain with some ache and soreness Aggravating factors: exercise Relieving factors: ice, medication, and compression  PRECAUTIONS: None  RED FLAGS: None   WEIGHT BEARING RESTRICTIONS: No  FALLS:  Has patient fallen in last 6 months? No  LIVING ENVIRONMENT: Lives with: lives with their spouse Lives in: House/apartment Stairs: Yes: External: 6 steps; can reach both; step to pattern Has following equipment at home: Single point cane  OCCUPATION: retired   PLOF: Independent  PATIENT GOALS: be able to get back in his shop (be able to lift up to 40-50 pounds), yard work, and be able to walk without a cane  NEXT MD VISIT: 05/28/23  OBJECTIVE:   PATIENT SURVEYS:  FOTO 62.13  COGNITION: Overall cognitive status: Within functional limits for tasks assessed     SENSATION: Patient reports no numbness or tingling  EDEMA:  Circumferential: Left tibiofemoral joint line: 40.3 cm Right tibiofemoral joint line: 36.5 cm   PALPATION: TTP: right quadriceps, medial and lateral joint line, popliteal fossa, and patella   LOWER EXTREMITY ROM:  Active ROM Right eval Left eval Right 05/01/23  Hip flexion     Hip extension     Hip abduction     Hip adduction     Hip internal rotation     Hip external rotation     Knee flexion 105/108 (PROM) 122 120 post-stretching  Knee extension 8 5   Ankle dorsiflexion     Ankle plantarflexion     Ankle inversion  Ankle eversion      (Blank rows = not tested)  LOWER EXTREMITY MMT: not tested due to surgical condition  GAIT: Assistive device utilized: Single point cane Level of assistance: Modified independence Comments: decreased gait speed and stride length with right knee flexed in stance phase   TODAY'S TREATMENT:                                                                                                                              DATE:    05/10/23:                                   EXERCISE LOG  Exercise Repetitions  and Resistance Comments  Recumbent bike Progressing to seat 4 over 16 minutes.     Knee ext 10# x 3 minutes   Ham curls 40# x 3 minutes           STW/M including IASTM x 8 minutes to patient's right knee superior incisional site f/b  LE elevation and vasopneumatic x 13 minutes on medium.     05/08/23:                                    EXERCISE LOG  Exercise Repetitions and Resistance Comments  Recumbent bike Level 3 x 15 minutes starting a seat 6 and ending 4.   Knee ext 10# x 3 minutes   Ham curls 30# x 3 minutes   Leg Press 2 plates x 3 minutes       LE elevation and vasopneumatic to patient's right knee x 20 minutes.    05/03/23:                                     EXERCISE LOG  Exercise Repetitions and Resistance Comments  Nustep  Level 4 x 5 minutes   Recumbent bike Seat 6 and 5 x 10 minutes at level 3.   Rockerboard  In parallel bars x 3 minutes           In supine:  Gentle low load passive stretching to patient's right knee into flexion and extension x 7 minutes f/b LE elevation and vasopneumatic x 15 minutes.  05/01/23:                                   EXERCISE LOG  Exercise Repetitions and Resistance Comments  Nustep Level 3 x 15 minutes moving seat forward x 2 to increase flexion.   Rockerboard in parallel bars 3 minutes.   In supine:  Gentle low load passive stretching to patient's right knee into flexion and extension x 8 minutes f/b LE  elevation and vasopneumatic x 15 minutes.   PATIENT EDUCATION:  Education details: See below. Person educated: Patient Education method: Explanation, demo Education comprehension: verbalized understanding, handout  HOME EXERCISE PROGRAM: HOME EXERCISE PROGRAM Created by [place] Etherine Mackowiak Sep 26th, 2024 View at [email or web address] using code: [email or web address]  Page 1 of 1 1 Exercise PRONE QUAD STRETCH WITH BELT OR STRAP Start by lying on your stomach with a strap or 2 belts linked together and looped it around your affected  side ankle. Next, use the belt to pull the knee into a bent position allowing for a stretch as shown. Repeat 5 Times Hold 1 Minute Complete 1 Set Perform 3 Times a Day  ASSESSMENT:  CLINICAL IMPRESSION: Patient CC is pain at the superior aspect of his left knee incisional site notable for scar tissue.  He tolerated STW/M including IASTM without complaint.  OBJECTIVE IMPAIRMENTS: Abnormal gait, decreased activity tolerance, decreased ROM, decreased strength, increased edema, and pain.   ACTIVITY LIMITATIONS: lifting, standing, squatting, sleeping, stairs, transfers, and locomotion level  PARTICIPATION LIMITATIONS: driving, shopping, community activity, and yard work  PERSONAL FACTORS: 3+ comorbidities: Hypertension, diabetes (type 1), spina bifida, and osteoarthritis  are also affecting patient's functional outcome.   REHAB POTENTIAL: Excellent  CLINICAL DECISION MAKING: Stable/uncomplicated  EVALUATION COMPLEXITY: Low   GOALS: Goals reviewed with patient? Yes  LONG TERM GOALS: Target date: 05/24/23  Patient will be independent with his HEP.  Baseline:  Goal status: INITIAL  2.  Patient will be able to demonstrate at least 120 degrees of right knee flexion for improved function navigating stairs.  Baseline:  Goal status: INITIAL  3.  Patient will improve his right knee extension within 5 degrees of neutral for improved gait mechanics.  Baseline:  Goal status: INITIAL  4.  Patient will be able to navigate at least 4 steps with a reciprocal pattern for improved household mobility.  Baseline:  Goal status: INITIAL  5.  Patient will be able to ambulate at least 80 feet without an assistive and no significant gait deviations for improved household mobility.  Baseline:  Goal status: INITIAL  PLAN:  PT FREQUENCY: 2x/week  PT DURATION: 4 weeks  PLANNED INTERVENTIONS: Therapeutic exercises, Therapeutic activity, Neuromuscular re-education, Balance training, Gait  training, Patient/Family education, Self Care, Joint mobilization, Stair training, Electrical stimulation, Cryotherapy, Moist heat, Vasopneumatic device, Manual therapy, and Re-evaluation  PLAN FOR NEXT SESSION: recumbent bike, squatting, lunges, lower extremity strengthening, gait training, manual therapy, and modalities as needed   Clarice Bonaventure, [place], PT 05/10/2023, 2:44 PM

## 2023-05-15 ENCOUNTER — Ambulatory Visit: Payer: Medicare Other | Admitting: Physical Therapy

## 2023-05-15 DIAGNOSIS — M25661 Stiffness of right knee, not elsewhere classified: Secondary | ICD-10-CM | POA: Diagnosis not present

## 2023-05-15 DIAGNOSIS — R600 Localized edema: Secondary | ICD-10-CM

## 2023-05-15 DIAGNOSIS — M25561 Pain in right knee: Secondary | ICD-10-CM

## 2023-05-15 NOTE — Therapy (Signed)
OUTPATIENT PHYSICAL THERAPY LOWER EXTREMITY EVALUATION   Patient Name: Grant Richardson. MRN: [id number] DOB:[birth year], 77 y.o., male Today's Date: 05/15/2023  END OF SESSION:  PT End of Session - 05/15/23 1444     Visit Number 6    Number of Visits 8    Date for PT Re-Evaluation 07/06/23    PT Start Time 0230    PT Stop Time 0323    PT Time Calculation (min) 53 min    Activity Tolerance Patient tolerated treatment well    Behavior During Therapy [facility] for tasks assessed/performed             Past Medical History:  Diagnosis Date   Diabetes mellitus without complication (HCC)    Hyperlipidemia    Self-catheterizes urinary bladder    Spina bifida (HCC)    Past Surgical History:  Procedure Laterality Date   KNEE ARTHROSCOPY Right    Patient Active Problem List   Diagnosis Date Noted   Decubitus ulcer of left perineal ischial region, stage 2 (HCC) 11/21/2016   Spina bifida (HCC) 11/21/2016   Recurrent UTI 06/09/2016   Unilateral primary osteoarthritis, left knee 05/18/2016   Primary osteoarthritis of both knees 01/27/2016   Liver hemangioma 07/15/2013   Neurogenic bladder 10/10/2011   Tethered spinal cord (HCC) 10/10/2011   Hypertension 10/09/2011   Type 1 diabetes mellitus (HCC) 10/09/2011   HLD (hyperlipidemia) 05/18/2011    PCP: Rebekah Chesterfield, NP  REFERRING PROVIDER: Levonne Lapping, MD   REFERRING DIAG: Aftercare following joint replacement surgery; Presence of right artificial knee joint   THERAPY DIAG:  Stiffness of right knee, not elsewhere classified  Acute pain of right knee  Localized edema  Rationale for Evaluation and Treatment: Rehabilitation  ONSET DATE: 04/12/23  SUBJECTIVE:   SUBJECTIVE STATEMENT:  Pain about a 2.  PERTINENT HISTORY: Hypertension, diabetes (type 1), spina bifida, and osteoarthritis PAIN:  Are you having pain? Yes: NPRS scale: Varies/10 Pain location: right anterior knee Pain description: constant dull  pain with some ache and soreness Aggravating factors: exercise Relieving factors: ice, medication, and compression  PRECAUTIONS: None  RED FLAGS: None   WEIGHT BEARING RESTRICTIONS: No  FALLS:  Has patient fallen in last 6 months? No  LIVING ENVIRONMENT: Lives with: lives with their spouse Lives in: House/apartment Stairs: Yes: External: 6 steps; can reach both; step to pattern Has following equipment at home: Single point cane  OCCUPATION: retired   PLOF: Independent  PATIENT GOALS: be able to get back in his shop (be able to lift up to 40-50 pounds), yard work, and be able to walk without a cane  NEXT MD VISIT: 05/28/23  OBJECTIVE:   PATIENT SURVEYS:  FOTO 62.13  COGNITION: Overall cognitive status: Within functional limits for tasks assessed     SENSATION: Patient reports no numbness or tingling  EDEMA:  Circumferential: Left tibiofemoral joint line: 40.3 cm Right tibiofemoral joint line: 36.5 cm   PALPATION: TTP: right quadriceps, medial and lateral joint line, popliteal fossa, and patella   LOWER EXTREMITY ROM:  Active ROM Right eval Left eval Right 05/01/23  Hip flexion     Hip extension     Hip abduction     Hip adduction     Hip internal rotation     Hip external rotation     Knee flexion 105/108 (PROM) 122 120 post-stretching  Knee extension 8 5   Ankle dorsiflexion     Ankle plantarflexion     Ankle inversion  Ankle eversion      (Blank rows = not tested)  LOWER EXTREMITY MMT: not tested due to surgical condition  GAIT: Assistive device utilized: Single point cane Level of assistance: Modified independence Comments: decreased gait speed and stride length with right knee flexed in stance phase   TODAY'S TREATMENT:                                                                                                                              DATE:    05/15/23:                                     EXERCISE LOG  Exercise Repetitions  and Resistance Comments  Recumbent bike Level 3 x 15 minutes progressing to seat 3.   Knee ext 10# x 3 minutes   Ham curls 40# x 3 minutes   Leg press  3 plates x 3 minutes.       STW/M x 10 minutes to right knee incisional site f/b Vasopneumatic on low x 15 minutes.  05/10/23:                                   EXERCISE LOG  Exercise Repetitions and Resistance Comments  Recumbent bike Progressing to seat 4 over 16 minutes.     Knee ext 10# x 3 minutes   Ham curls 40# x 3 minutes           STW/M including IASTM x 8 minutes to patient's right knee superior incisional site f/b  LE elevation and vasopneumatic x 13 minutes on medium.     05/08/23:                                    EXERCISE LOG  Exercise Repetitions and Resistance Comments  Recumbent bike Level 3 x 15 minutes starting a seat 6 and ending 4.   Knee ext 10# x 3 minutes   Ham curls 30# x 3 minutes   Leg Press 2 plates x 3 minutes       LE elevation and vasopneumatic to patient's right knee x 20 minutes.    05/03/23:                                     EXERCISE LOG  Exercise Repetitions and Resistance Comments  Nustep  Level 4 x 5 minutes   Recumbent bike Seat 6 and 5 x 10 minutes at level 3.   Rockerboard  In parallel bars x 3 minutes           In supine:  Gentle low load passive stretching to patient's right  knee into flexion and extension x 7 minutes f/b LE elevation and vasopneumatic x 15 minutes.  05/01/23:                                   EXERCISE LOG  Exercise Repetitions and Resistance Comments  Nustep Level 3 x 15 minutes moving seat forward x 2 to increase flexion.   Rockerboard in parallel bars 3 minutes.   In supine:  Gentle low load passive stretching to patient's right knee into flexion and extension x 8 minutes f/b LE elevation and vasopneumatic x 15 minutes.   PATIENT EDUCATION:  Education details: See below. Person educated: Patient Education method: Explanation, demo Education  comprehension: verbalized understanding, handout  HOME EXERCISE PROGRAM: HOME EXERCISE PROGRAM Created by [place] Starlyn Droge Sep 26th, 2024 View at [email or web address] using code: [email or web address]  Page 1 of 1 1 Exercise PRONE QUAD STRETCH WITH BELT OR STRAP Start by lying on your stomach with a strap or 2 belts linked together and looped it around your affected side ankle. Next, use the belt to pull the knee into a bent position allowing for a stretch as shown. Repeat 5 Times Hold 1 Minute Complete 1 Set Perform 3 Times a Day  ASSESSMENT:  CLINICAL IMPRESSION: Excellent progress toward goals.  FOTO score improved to 69.  OBJECTIVE IMPAIRMENTS: Abnormal gait, decreased activity tolerance, decreased ROM, decreased strength, increased edema, and pain.   ACTIVITY LIMITATIONS: lifting, standing, squatting, sleeping, stairs, transfers, and locomotion level  PARTICIPATION LIMITATIONS: driving, shopping, community activity, and yard work  PERSONAL FACTORS: 3+ comorbidities: Hypertension, diabetes (type 1), spina bifida, and osteoarthritis  are also affecting patient's functional outcome.   REHAB POTENTIAL: Excellent  CLINICAL DECISION MAKING: Stable/uncomplicated  EVALUATION COMPLEXITY: Low   GOALS: Goals reviewed with patient? Yes  LONG TERM GOALS: Target date: 05/24/23  Patient will be independent with his HEP.  Baseline:  Goal status: INITIAL  2.  Patient will be able to demonstrate at least 120 degrees of right knee flexion for improved function navigating stairs.  Baseline:  Goal status: INITIAL  3.  Patient will improve his right knee extension within 5 degrees of neutral for improved gait mechanics.  Baseline:  Goal status: INITIAL  4.  Patient will be able to navigate at least 4 steps with a reciprocal pattern for improved household mobility.  Baseline:  Goal status: INITIAL  5.  Patient will be able to ambulate at least 80 feet without an assistive and no  significant gait deviations for improved household mobility.  Baseline:  Goal status: INITIAL  PLAN:  PT FREQUENCY: 2x/week  PT DURATION: 4 weeks  PLANNED INTERVENTIONS: Therapeutic exercises, Therapeutic activity, Neuromuscular re-education, Balance training, Gait training, Patient/Family education, Self Care, Joint mobilization, Stair training, Electrical stimulation, Cryotherapy, Moist heat, Vasopneumatic device, Manual therapy, and Re-evaluation  PLAN FOR NEXT SESSION: recumbent bike, squatting, lunges, lower extremity strengthening, gait training, manual therapy, and modalities as needed   [place], [place], PT 05/15/2023, 4:03 PM

## 2023-05-17 ENCOUNTER — Encounter: Payer: Self-pay | Admitting: *Deleted

## 2023-05-17 ENCOUNTER — Ambulatory Visit: Payer: Medicare Other | Admitting: *Deleted

## 2023-05-17 DIAGNOSIS — M25661 Stiffness of right knee, not elsewhere classified: Secondary | ICD-10-CM

## 2023-05-17 DIAGNOSIS — R600 Localized edema: Secondary | ICD-10-CM

## 2023-05-17 DIAGNOSIS — M25561 Pain in right knee: Secondary | ICD-10-CM

## 2023-05-17 NOTE — Therapy (Signed)
OUTPATIENT PHYSICAL THERAPY LOWER EXTREMITY TREATMENT   Patient Name: Grant Richardson. MRN: [id number] DOB:April 15, 1946, 77 y.o., male Today's Date: 05/17/2023  END OF SESSION:  PT End of Session - 05/17/23 1603     Visit Number 7    Number of Visits 8    Date for PT Re-Evaluation 07/06/23    PT Start Time 1600    PT Stop Time 1651    PT Time Calculation (min) 51 min             Past Medical History:  Diagnosis Date   Diabetes mellitus without complication (HCC)    Hyperlipidemia    Self-catheterizes urinary bladder    Spina bifida (HCC)    Past Surgical History:  Procedure Laterality Date   KNEE ARTHROSCOPY Right    Patient Active Problem List   Diagnosis Date Noted   Decubitus ulcer of left perineal ischial region, stage 2 (HCC) 11/21/2016   Spina bifida (HCC) 11/21/2016   Recurrent UTI 06/09/2016   Unilateral primary osteoarthritis, left knee 05/18/2016   Primary osteoarthritis of both knees 01/27/2016   Liver hemangioma 07/15/2013   Neurogenic bladder 10/10/2011   Tethered spinal cord (HCC) 10/10/2011   Hypertension 10/09/2011   Type 1 diabetes mellitus (HCC) 10/09/2011   HLD (hyperlipidemia) 05/18/2011    PCP: Rebekah Chesterfield, NP  REFERRING PROVIDER: Levonne Lapping, MD   REFERRING DIAG: Aftercare following joint replacement surgery; Presence of right artificial knee joint   THERAPY DIAG:  Stiffness of right knee, not elsewhere classified  Acute pain of right knee  Localized edema  Rationale for Evaluation and Treatment: Rehabilitation  ONSET DATE: 04/12/23  SUBJECTIVE:   SUBJECTIVE STATEMENT:  Pain about a 2/10 medial side of knee  PERTINENT HISTORY: Hypertension, diabetes (type 1), spina bifida, and osteoarthritis PAIN:  Are you having pain? Yes: NPRS scale: Varies/10 Pain location: right anterior knee Pain description: constant dull pain with some ache and soreness Aggravating factors: exercise Relieving factors: ice,  medication, and compression  PRECAUTIONS: None  RED FLAGS: None   WEIGHT BEARING RESTRICTIONS: No  FALLS:  Has patient fallen in last 6 months? No  LIVING ENVIRONMENT: Lives with: lives with their spouse Lives in: House/apartment Stairs: Yes: External: 6 steps; can reach both; step to pattern Has following equipment at home: Single point cane  OCCUPATION: retired   PLOF: Independent  PATIENT GOALS: be able to get back in his shop (be able to lift up to 40-50 pounds), yard work, and be able to walk without a cane  NEXT MD VISIT: 05/28/23  OBJECTIVE:   PATIENT SURVEYS:  FOTO 62.13  COGNITION: Overall cognitive status: Within functional limits for tasks assessed     SENSATION: Patient reports no numbness or tingling  EDEMA:  Circumferential: Left tibiofemoral joint line: 40.3 cm Right tibiofemoral joint line: 36.5 cm   PALPATION: TTP: right quadriceps, medial and lateral joint line, popliteal fossa, and patella   LOWER EXTREMITY ROM:  Active ROM Right eval Left eval Right 05/01/23  Hip flexion     Hip extension     Hip abduction     Hip adduction     Hip internal rotation     Hip external rotation     Knee flexion 105/108 (PROM) 122 120 post-stretching  Knee extension 8 5   Ankle dorsiflexion     Ankle plantarflexion     Ankle inversion     Ankle eversion      (Blank rows = not tested)  LOWER  EXTREMITY MMT: not tested due to surgical condition  GAIT: Assistive device utilized: Single point cane Level of assistance: Modified independence Comments: decreased gait speed and stride length with right knee flexed in stance phase   TODAY'S TREATMENT:                                                                                                                              DATE:    05/17/23:                                     EXERCISE LOG  Exercise Repetitions and Resistance Comments  Recumbent bike Level 3 x progressing to seat 3.    Nustep  L4 x10 mins     Knee ext 10# x 3 minutes   Ham curls 40# x 3 minutes   Leg press  3 plates x 3 minutes.       STW/M x 10 minutes to right knee incisional site  and PROM flexion to 113 and ext -  Vasopneumatic on low x 15 minutes.  05/10/23:                                   EXERCISE LOG  Exercise Repetitions and Resistance Comments  Recumbent bike Progressing to seat 4 over 16 minutes.     Knee ext 10# x 3 minutes   Ham curls 40# x 3 minutes           STW/M including IASTM x 8 minutes to patient's right knee superior incisional site f/b  LE elevation and vasopneumatic x 13 minutes on medium.     05/08/23:                                    EXERCISE LOG  Exercise Repetitions and Resistance Comments  Recumbent bike Level 3 x 15 minutes starting a seat 6 and ending 4.   Knee ext 10# x 3 minutes   Ham curls 30# x 3 minutes   Leg Press 2 plates x 3 minutes       LE elevation and vasopneumatic to patient's right knee x 20 minutes.   PATIENT EDUCATION:  Education details: See below. Person educated: Patient Education method: Explanation, demo Education comprehension: verbalized understanding, handout  HOME EXERCISE PROGRAM: HOME EXERCISE PROGRAM Created by [name] Sep 26th, 2024 View at [email or web address] using code: [email or web address]  Page 1 of 1 1 Exercise PRONE QUAD STRETCH WITH BELT OR STRAP Start by lying on your stomach with a strap or 2 belts linked together and looped it around your affected side ankle. Next, use the belt to pull the knee into a bent position allowing for a stretch as shown. Repeat 5 Times  Hold 1 Minute Complete 1 Set Perform 3 Times a Day  ASSESSMENT:  CLINICAL IMPRESSION: Pt arrived today doing fairly well with RT knee with low pain levels.He was able to continue with therex for RT LE and did well. PROM progression 5-113 degrees today. 1 visit left. Recert?    OBJECTIVE IMPAIRMENTS: Abnormal gait, decreased activity tolerance,  decreased ROM, decreased strength, increased edema, and pain.   ACTIVITY LIMITATIONS: lifting, standing, squatting, sleeping, stairs, transfers, and locomotion level  PARTICIPATION LIMITATIONS: driving, shopping, community activity, and yard work  PERSONAL FACTORS: 3+ comorbidities: Hypertension, diabetes (type 1), spina bifida, and osteoarthritis  are also affecting patient's functional outcome.   REHAB POTENTIAL: Excellent  CLINICAL DECISION MAKING: Stable/uncomplicated  EVALUATION COMPLEXITY: Low   GOALS: Goals reviewed with patient? Yes  LONG TERM GOALS: Target date: 05/24/23  Patient will be independent with his HEP.  Baseline:  Goal status: MET  2.  Patient will be able to demonstrate at least 120 degrees of right knee flexion for improved function navigating stairs.  Baseline:  Goal status:   3.  Patient will improve his right knee extension within 5 degrees of neutral for improved gait mechanics.  Baseline:  Goal status:   4.  Patient will be able to navigate at least 4 steps with a reciprocal pattern for improved household mobility.  Baseline:  Goal status: Partially MET  5.  Patient will be able to ambulate at least 80 feet without an assistive and no significant gait deviations for improved household mobility.  Baseline:  Goal status: MET  PLAN:  PT FREQUENCY: 2x/week  PT DURATION: 4 weeks  PLANNED INTERVENTIONS: Therapeutic exercises, Therapeutic activity, Neuromuscular re-education, Balance training, Gait training, Patient/Family education, Self Care, Joint mobilization, Stair training, Electrical stimulation, Cryotherapy, Moist heat, Vasopneumatic device, Manual therapy, and Re-evaluation  PLAN FOR NEXT SESSION: recumbent bike, squatting, lunges, lower extremity strengthening, gait training, manual therapy, and modalities as needed   Cheyla Duchemin,CHRIS, PTA 05/17/2023, 5:59 PM

## 2023-05-22 ENCOUNTER — Ambulatory Visit: Payer: Medicare Other | Admitting: *Deleted

## 2023-05-22 ENCOUNTER — Encounter: Payer: Self-pay | Admitting: *Deleted

## 2023-05-22 DIAGNOSIS — M25661 Stiffness of right knee, not elsewhere classified: Secondary | ICD-10-CM | POA: Diagnosis not present

## 2023-05-22 DIAGNOSIS — M25561 Pain in right knee: Secondary | ICD-10-CM

## 2023-05-22 DIAGNOSIS — R600 Localized edema: Secondary | ICD-10-CM

## 2023-05-22 NOTE — Therapy (Signed)
OUTPATIENT PHYSICAL THERAPY LOWER EXTREMITY TREATMENT   Patient Name: Grant Richardson. MRN: [id number] DOB:[birth year], 77 y.o., male Today's Date: 05/22/2023  END OF SESSION:  PT End of Session - 05/22/23 1434     Visit Number 8    Number of Visits 8    Date for PT Re-Evaluation 07/06/23    PT Start Time 1430    PT Stop Time 1530    PT Time Calculation (min) 60 min             Past Medical History:  Diagnosis Date   Diabetes mellitus without complication (HCC)    Hyperlipidemia    Self-catheterizes urinary bladder    Spina bifida (HCC)    Past Surgical History:  Procedure Laterality Date   KNEE ARTHROSCOPY Right    Patient Active Problem List   Diagnosis Date Noted   Decubitus ulcer of left perineal ischial region, stage 2 (HCC) 11/21/2016   Spina bifida (HCC) 11/21/2016   Recurrent UTI 06/09/2016   Unilateral primary osteoarthritis, left knee 05/18/2016   Primary osteoarthritis of both knees 01/27/2016   Liver hemangioma 07/15/2013   Neurogenic bladder 10/10/2011   Tethered spinal cord (HCC) 10/10/2011   Hypertension 10/09/2011   Type 1 diabetes mellitus (HCC) 10/09/2011   HLD (hyperlipidemia) 05/18/2011    PCP: Rebekah Chesterfield, NP  REFERRING PROVIDER: Levonne Lapping, MD   REFERRING DIAG: Aftercare following joint replacement surgery; Presence of right artificial knee joint   THERAPY DIAG:  Stiffness of right knee, not elsewhere classified  Acute pain of right knee  Localized edema  Rationale for Evaluation and Treatment: Rehabilitation  ONSET DATE: 04/12/23  SUBJECTIVE:   SUBJECTIVE STATEMENT:  Pain about a 2/10 medial side of knee is sore. Doing good, but would like to cont. PT to work on ROM progression  PERTINENT HISTORY: Hypertension, diabetes (type 1), spina bifida, and osteoarthritis PAIN:  Are you having pain? Yes: NPRS scale: Varies/10 Pain location: right anterior knee Pain description: constant dull pain with some ache and  soreness Aggravating factors: exercise Relieving factors: ice, medication, and compression  PRECAUTIONS: None  RED FLAGS: None   WEIGHT BEARING RESTRICTIONS: No  FALLS:  Has patient fallen in last 6 months? No  LIVING ENVIRONMENT: Lives with: lives with their spouse Lives in: House/apartment Stairs: Yes: External: 6 steps; can reach both; step to pattern Has following equipment at home: Single point cane  OCCUPATION: retired   PLOF: Independent  PATIENT GOALS: be able to get back in his shop (be able to lift up to 40-50 pounds), yard work, and be able to walk without a cane  NEXT MD VISIT: 05/28/23  OBJECTIVE:   PATIENT SURVEYS:  FOTO 62.13  COGNITION: Overall cognitive status: Within functional limits for tasks assessed     SENSATION: Patient reports no numbness or tingling  EDEMA:  Circumferential: Left tibiofemoral joint line: 40.3 cm Right tibiofemoral joint line: 36.5 cm   PALPATION: TTP: right quadriceps, medial and lateral joint line, popliteal fossa, and patella   LOWER EXTREMITY ROM:  Active ROM Right eval Left eval Right 05/01/23  Hip flexion     Hip extension     Hip abduction     Hip adduction     Hip internal rotation     Hip external rotation     Knee flexion 105/108 (PROM) 122 120 post-stretching  Knee extension 8 5   Ankle dorsiflexion     Ankle plantarflexion     Ankle inversion  Ankle eversion      (Blank rows = not tested)  LOWER EXTREMITY MMT: not tested due to surgical condition  GAIT: Assistive device utilized: Single point cane Level of assistance: Modified independence Comments: decreased gait speed and stride length with right knee flexed in stance phase   TODAY'S TREATMENT:                                                                                                                              DATE:                       05/22/23:                                     EXERCISE LOG   RT knee  Exercise  Repetitions and Resistance Comments  Recumbent bike Level 3 x 15  minutes progressing to seat 3.   Nustep       Knee ext 10# x 3 minutes   Ham curls 40# x 3 minutes   Leg press  2 plates x 3 minutes.   Seat  7       STW/M x 10 minutes to right knee incisional site  and PROM flexion to 113 and ext -5  Vasopneumatic on low x 15 minutes.  05/10/23:                                   EXERCISE LOG  Exercise Repetitions and Resistance Comments  Recumbent bike Progressing to seat 4 over 16 minutes.     Knee ext 10# x 3 minutes   Ham curls 40# x 3 minutes           STW/M including IASTM x 8 minutes to patient's right knee superior incisional site f/b  LE elevation and vasopneumatic x 13 minutes on medium.     05/08/23:                                    EXERCISE LOG  Exercise Repetitions and Resistance Comments  Recumbent bike Level 3 x 15 minutes starting a seat 6 and ending 4.   Knee ext 10# x 3 minutes   Ham curls 30# x 3 minutes   Leg Press 2 plates x 3 minutes       LE elevation and vasopneumatic to patient's right knee x 20 minutes.   PATIENT EDUCATION:  Education details: See below. Person educated: Patient Education method: Explanation, demo Education comprehension: verbalized understanding, handout  HOME EXERCISE PROGRAM: HOME EXERCISE PROGRAM Created by [name] Sep 26th, 2024 View at [email or web address] using code: [email or web address]  Page 1 of 1 1 Exercise PRONE QUAD STRETCH WITH BELT OR STRAP Start  by lying on your stomach with a strap or 2 belts linked together and looped it around your affected side ankle. Next, use the belt to pull the knee into a bent position allowing for a stretch as shown. Repeat 5 Times Hold 1 Minute Complete 1 Set Perform 3 Times a Day  ASSESSMENT:  CLINICAL IMPRESSION: Pt arrived today doing fairly well  again with RT knee with low pain levels, but soreness medial aspect. He was able to continue with therex for RT LE strengthening  and ROM and did well. PROM progression 5-113 degrees today. Recert to progress ROM to meet LTG's    OBJECTIVE IMPAIRMENTS: Abnormal gait, decreased activity tolerance, decreased ROM, decreased strength, increased edema, and pain.   ACTIVITY LIMITATIONS: lifting, standing, squatting, sleeping, stairs, transfers, and locomotion level  PARTICIPATION LIMITATIONS: driving, shopping, community activity, and yard work  PERSONAL FACTORS: 3+ comorbidities: Hypertension, diabetes (type 1), spina bifida, and osteoarthritis  are also affecting patient's functional outcome.   REHAB POTENTIAL: Excellent  CLINICAL DECISION MAKING: Stable/uncomplicated  EVALUATION COMPLEXITY: Low   GOALS: Goals reviewed with patient? Yes  LONG TERM GOALS: Target date: 05/24/23  Patient will be independent with his HEP.  Baseline:  Goal status: MET  2.  Patient will be able to demonstrate at least 120 degrees of right knee flexion for improved function navigating stairs.  Baseline:  Goal status: On going   115 degrees  3.  Patient will improve his right knee extension within 5 degrees of neutral for improved gait mechanics.  Baseline:  Goal status: On going   4.  Patient will be able to navigate at least 4 steps with a reciprocal pattern for improved household mobility.  Baseline:  Goal status: Partially MET  5.  Patient will be able to ambulate at least 80 feet without an assistive and no significant gait deviations for improved household mobility.  Baseline:  Goal status: MET  PLAN:  PT FREQUENCY: 2x/week  PT DURATION: 4 weeks  PLANNED INTERVENTIONS: Therapeutic exercises, Therapeutic activity, Neuromuscular re-education, Balance training, Gait training, Patient/Family education, Self Care, Joint mobilization, Stair training, Electrical stimulation, Cryotherapy, Moist heat, Vasopneumatic device, Manual therapy, and Re-evaluation  PLAN FOR NEXT SESSION:   Recert recumbent bike, squatting, lunges,  lower extremity strengthening, gait training, manual therapy, and modalities as needed   Octavious Zidek,CHRIS, PTA 05/22/2023, 3:30 PM

## 2023-05-23 NOTE — Addendum Note (Signed)
Addended by: Sanskriti Greenlaw, [place] W on: 05/23/2023 08:40 AM   Modules accepted: Orders

## 2023-05-24 ENCOUNTER — Encounter: Payer: Self-pay | Admitting: *Deleted

## 2023-05-24 ENCOUNTER — Ambulatory Visit: Payer: Medicare Other | Admitting: *Deleted

## 2023-05-24 DIAGNOSIS — M25661 Stiffness of right knee, not elsewhere classified: Secondary | ICD-10-CM | POA: Diagnosis not present

## 2023-05-24 DIAGNOSIS — R600 Localized edema: Secondary | ICD-10-CM

## 2023-05-24 DIAGNOSIS — M25561 Pain in right knee: Secondary | ICD-10-CM

## 2023-05-24 NOTE — Therapy (Signed)
OUTPATIENT PHYSICAL THERAPY LOWER EXTREMITY TREATMENT   Patient Name: Sevag Shearn. MRN: [id number] DOB:10-24-1945, 77 y.o., male Today's Date: 05/24/2023  END OF SESSION:  PT End of Session - 05/24/23 1457     Visit Number 9    Number of Visits 14    Date for PT Re-Evaluation 07/06/23    PT Start Time 1430    PT Stop Time 1520    PT Time Calculation (min) 50 min             Past Medical History:  Diagnosis Date   Diabetes mellitus without complication (HCC)    Hyperlipidemia    Self-catheterizes urinary bladder    Spina bifida (HCC)    Past Surgical History:  Procedure Laterality Date   KNEE ARTHROSCOPY Right    Patient Active Problem List   Diagnosis Date Noted   Decubitus ulcer of left perineal ischial region, stage 2 (HCC) 11/21/2016   Spina bifida (HCC) 11/21/2016   Recurrent UTI 06/09/2016   Unilateral primary osteoarthritis, left knee 05/18/2016   Primary osteoarthritis of both knees 01/27/2016   Liver hemangioma 07/15/2013   Neurogenic bladder 10/10/2011   Tethered spinal cord (HCC) 10/10/2011   Hypertension 10/09/2011   Type 1 diabetes mellitus (HCC) 10/09/2011   HLD (hyperlipidemia) 05/18/2011    PCP: Rebekah Chesterfield, NP  REFERRING PROVIDER: Levonne Lapping, MD   REFERRING DIAG: Aftercare following joint replacement surgery; Presence of right artificial knee joint   THERAPY DIAG:  Stiffness of right knee, not elsewhere classified  Acute pain of right knee  Localized edema  Rationale for Evaluation and Treatment: Rehabilitation  ONSET DATE: 04/12/23  SUBJECTIVE:   SUBJECTIVE STATEMENT:  Pain about a 2-3/10 medial side of knee is sore. Ddid good after last Rx. To MD Monday  PERTINENT HISTORY: Hypertension, diabetes (type 1), spina bifida, and osteoarthritis PAIN:  Are you having pain? Yes: NPRS scale: Varies/10 Pain location: right anterior knee Pain description: constant dull pain with some ache and soreness Aggravating  factors: exercise Relieving factors: ice, medication, and compression  PRECAUTIONS: None  RED FLAGS: None   WEIGHT BEARING RESTRICTIONS: No  FALLS:  Has patient fallen in last 6 months? No  LIVING ENVIRONMENT: Lives with: lives with their spouse Lives in: House/apartment Stairs: Yes: External: 6 steps; can reach both; step to pattern Has following equipment at home: Single point cane  OCCUPATION: retired   PLOF: Independent  PATIENT GOALS: be able to get back in his shop (be able to lift up to 40-50 pounds), yard work, and be able to walk without a cane  NEXT MD VISIT: 05/28/23  OBJECTIVE:   PATIENT SURVEYS:  FOTO 62.13  COGNITION: Overall cognitive status: Within functional limits for tasks assessed     SENSATION: Patient reports no numbness or tingling  EDEMA:  Circumferential: Left tibiofemoral joint line: 40.3 cm Right tibiofemoral joint line: 36.5 cm   PALPATION: TTP: right quadriceps, medial and lateral joint line, popliteal fossa, and patella   LOWER EXTREMITY ROM:  Active ROM Right eval Left eval Right 05/01/23  Hip flexion     Hip extension     Hip abduction     Hip adduction     Hip internal rotation     Hip external rotation     Knee flexion 105/108 (PROM) 122 120 post-stretching  Knee extension 8 5   Ankle dorsiflexion     Ankle plantarflexion     Ankle inversion     Ankle eversion      (  Blank rows = not tested)  LOWER EXTREMITY MMT: not tested due to surgical condition  GAIT: Assistive device utilized: Single point cane Level of assistance: Modified independence Comments: decreased gait speed and stride length with right knee flexed in stance phase   TODAY'S TREATMENT:                                                                                                                              DATE:                       05/24/23:                                     EXERCISE LOG   RT knee  Exercise Repetitions and Resistance  Comments  Recumbent bike Level 3 x 20  minutes progressing to seat 3.   Nustep       Knee ext 10# x 3 minutes   Ham curls 40# x 3 minutes   Leg press  2 plates x 4 minutes.   Seat  7   12in box lunge    Rocker board calf stretch X 3 mins   STW/M  Vasopneumatic on low x 15 minutes.  05/10/23:                                   EXERCISE LOG  Exercise Repetitions and Resistance Comments  Recumbent bike Progressing to seat 4 over 16 minutes.     Knee ext 10# x 3 minutes   Ham curls 40# x 3 minutes           STW/M including IASTM x 8 minutes to patient's right knee superior incisional site f/b  LE elevation and vasopneumatic x 13 minutes on medium.     05/08/23:                                    EXERCISE LOG  Exercise Repetitions and Resistance Comments  Recumbent bike Level 3 x 15 minutes starting a seat 6 and ending 4.   Knee ext 10# x 3 minutes   Ham curls 30# x 3 minutes   Leg Press 2 plates x 3 minutes       LE elevation and vasopneumatic to patient's right knee x 20 minutes.   PATIENT EDUCATION:  Education details: See below. Person educated: Patient Education method: Explanation, demo Education comprehension: verbalized understanding, handout  HOME EXERCISE PROGRAM: HOME EXERCISE PROGRAM Created by [name] Sep 26th, 2024 View at [email or web address] using code: [email or web address]  Page 1 of 1 1 Exercise PRONE QUAD STRETCH WITH BELT OR STRAP Start by lying on your stomach with a strap or 2 belts linked together  and looped it around your affected side ankle. Next, use the belt to pull the knee into a bent position allowing for a stretch as shown. Repeat 5 Times Hold 1 Minute Complete 1 Set Perform 3 Times a Day  ASSESSMENT:  CLINICAL IMPRESSION: Pt arrived today doing fairly well RT knee. He was able to continue with RT LE strengthening as well as ROM progression. Vaso end of session for edema control. 5-113 degrees today RT knee   Recert sent  OBJECTIVE  IMPAIRMENTS: Abnormal gait, decreased activity tolerance, decreased ROM, decreased strength, increased edema, and pain.   ACTIVITY LIMITATIONS: lifting, standing, squatting, sleeping, stairs, transfers, and locomotion level  PARTICIPATION LIMITATIONS: driving, shopping, community activity, and yard work  PERSONAL FACTORS: 3+ comorbidities: Hypertension, diabetes (type 1), spina bifida, and osteoarthritis  are also affecting patient's functional outcome.   REHAB POTENTIAL: Excellent  CLINICAL DECISION MAKING: Stable/uncomplicated  EVALUATION COMPLEXITY: Low   GOALS: Goals reviewed with patient? Yes  LONG TERM GOALS: Target date: 05/24/23  Patient will be independent with his HEP.  Baseline:  Goal status: MET  2.  Patient will be able to demonstrate at least 120 degrees of right knee flexion for improved function navigating stairs.  Baseline:  Goal status: On going   115 degrees  3.  Patient will improve his right knee extension within 5 degrees of neutral for improved gait mechanics.  Baseline:  Goal status: On going   4.  Patient will be able to navigate at least 4 steps with a reciprocal pattern for improved household mobility.  Baseline:  Goal status: Partially MET  5.  Patient will be able to ambulate at least 80 feet without an assistive and no significant gait deviations for improved household mobility.  Baseline:  Goal status: MET  PLAN:  PT FREQUENCY: 2x/week  PT DURATION: 4 weeks  PLANNED INTERVENTIONS: Therapeutic exercises, Therapeutic activity, Neuromuscular re-education, Balance training, Gait training, Patient/Family education, Self Care, Joint mobilization, Stair training, Electrical stimulation, Cryotherapy, Moist heat, Vasopneumatic device, Manual therapy, and Re-evaluation  PLAN FOR NEXT SESSION:   Recert recumbent bike, squatting, lunges, lower extremity strengthening, gait training, manual therapy, and modalities as needed   Kyree Adriano,CHRIS,  PTA 05/24/2023, 5:19 PM

## 2023-05-29 ENCOUNTER — Encounter: Payer: Self-pay | Admitting: *Deleted

## 2023-05-29 ENCOUNTER — Ambulatory Visit: Payer: Medicare Other | Admitting: *Deleted

## 2023-05-29 DIAGNOSIS — M25661 Stiffness of right knee, not elsewhere classified: Secondary | ICD-10-CM

## 2023-05-29 DIAGNOSIS — M25561 Pain in right knee: Secondary | ICD-10-CM

## 2023-05-29 DIAGNOSIS — R600 Localized edema: Secondary | ICD-10-CM

## 2023-05-29 NOTE — Therapy (Signed)
OUTPATIENT PHYSICAL THERAPY LOWER EXTREMITY TREATMENT   Patient Name: Grant Richardson. MRN: [id number] DOB:1945/12/01, 77 y.o., male Today's Date: 05/29/2023  END OF SESSION:  PT End of Session - 05/29/23 1442     Visit Number 10    Number of Visits 14    Date for PT Re-Evaluation 07/06/23    PT Start Time 1430    PT Stop Time 1528    PT Time Calculation (min) 58 min             Past Medical History:  Diagnosis Date   Diabetes mellitus without complication (HCC)    Hyperlipidemia    Self-catheterizes urinary bladder    Spina bifida (HCC)    Past Surgical History:  Procedure Laterality Date   KNEE ARTHROSCOPY Right    Patient Active Problem List   Diagnosis Date Noted   Decubitus ulcer of left perineal ischial region, stage 2 (HCC) 11/21/2016   Spina bifida (HCC) 11/21/2016   Recurrent UTI 06/09/2016   Unilateral primary osteoarthritis, left knee 05/18/2016   Primary osteoarthritis of both knees 01/27/2016   Liver hemangioma 07/15/2013   Neurogenic bladder 10/10/2011   Tethered spinal cord (HCC) 10/10/2011   Hypertension 10/09/2011   Type 1 diabetes mellitus (HCC) 10/09/2011   HLD (hyperlipidemia) 05/18/2011    PCP: Rebekah Chesterfield, NP  REFERRING PROVIDER: Levonne Lapping, MD   REFERRING DIAG: Aftercare following joint replacement surgery; Presence of right artificial knee joint   THERAPY DIAG:  Stiffness of right knee, not elsewhere classified  Acute pain of right knee  Localized edema  Rationale for Evaluation and Treatment: Rehabilitation  ONSET DATE: 04/12/23  SUBJECTIVE:   SUBJECTIVE STATEMENT:  Pain about a 2-3/10 medial side of knee is sore.  MD was pleased  PERTINENT HISTORY: Hypertension, diabetes (type 1), spina bifida, and osteoarthritis PAIN:  Are you having pain? Yes: NPRS scale: Varies/10 Pain location: right anterior knee Pain description: constant dull pain with some ache and soreness Aggravating factors:  exercise Relieving factors: ice, medication, and compression  PRECAUTIONS: None  RED FLAGS: None   WEIGHT BEARING RESTRICTIONS: No  FALLS:  Has patient fallen in last 6 months? No  LIVING ENVIRONMENT: Lives with: lives with their spouse Lives in: House/apartment Stairs: Yes: External: 6 steps; can reach both; step to pattern Has following equipment at home: Single point cane  OCCUPATION: retired   PLOF: Independent  PATIENT GOALS: be able to get back in his shop (be able to lift up to 40-50 pounds), yard work, and be able to walk without a cane  NEXT MD VISIT: 05/28/23  OBJECTIVE:   PATIENT SURVEYS:  FOTO 62.13  COGNITION: Overall cognitive status: Within functional limits for tasks assessed     SENSATION: Patient reports no numbness or tingling  EDEMA:  Circumferential: Left tibiofemoral joint line: 40.3 cm Right tibiofemoral joint line: 36.5 cm   PALPATION: TTP: right quadriceps, medial and lateral joint line, popliteal fossa, and patella   LOWER EXTREMITY ROM:  Active ROM Right eval Left eval Right 05/01/23  Hip flexion     Hip extension     Hip abduction     Hip adduction     Hip internal rotation     Hip external rotation     Knee flexion 105/108 (PROM) 122 120 post-stretching  Knee extension 8 5   Ankle dorsiflexion     Ankle plantarflexion     Ankle inversion     Ankle eversion      (Blank  rows = not tested)  LOWER EXTREMITY MMT: not tested due to surgical condition  GAIT: Assistive device utilized: Single point cane Level of assistance: Modified independence Comments: decreased gait speed and stride length with right knee flexed in stance phase   TODAY'S TREATMENT:                                                                                                                              DATE:                       05/29/23:                                     EXERCISE LOG   RT knee  Exercise Repetitions and Resistance Comments   Recumbent bike Level 4-5  x 15  minutes progressing to seat 3.   Nustep       Knee ext 20# x 3 minutes   Ham curls 40# x 3 minutes   Leg press  2 plates x .   Seat  7   12in box lunge X15  RT knee flexion stretch   Rocker board calf stretch X 3 mins   Manual PROM for extension  Vasopneumatic on low x 10 minutes.  05/10/23:                                   EXERCISE LOG  Exercise Repetitions and Resistance Comments  Recumbent bike Progressing to seat 4 over 16 minutes.     Knee ext 10# x 3 minutes   Ham curls 40# x 3 minutes           STW/M including IASTM x 8 minutes to patient's right knee superior incisional site f/b  LE elevation and vasopneumatic x 13 minutes on medium.     05/08/23:                                    EXERCISE LOG  Exercise Repetitions and Resistance Comments  Recumbent bike Level 3 x 15 minutes starting a seat 6 and ending 4.   Knee ext 10# x 3 minutes   Ham curls 30# x 3 minutes   Leg Press 2 plates x 3 minutes       LE elevation and vasopneumatic to patient's right knee x 20 minutes.   PATIENT EDUCATION:  Education details: See below. Person educated: Patient Education method: Explanation, demo Education comprehension: verbalized understanding, handout  HOME EXERCISE PROGRAM: HOME EXERCISE PROGRAM Created by [name] Sep 26th, 2024 View at [email or web address] using code: [email or web address]  Page 1 of 1 1 Exercise PRONE QUAD STRETCH WITH BELT OR STRAP Start by lying on your stomach with  a strap or 2 belts linked together and looped it around your affected side ankle. Next, use the belt to pull the knee into a bent position allowing for a stretch as shown. Repeat 5 Times Hold 1 Minute Complete 1 Set Perform 3 Times a Day  ASSESSMENT:  CLINICAL IMPRESSION: Pt arrived today doing fairly well RT knee and reports MD was pleased with current status. Rx focused on RT knee ROM progression as well as strengthening and did well. Flexion  to 115 degrees today. Vaso end of session.  OBJECTIVE IMPAIRMENTS: Abnormal gait, decreased activity tolerance, decreased ROM, decreased strength, increased edema, and pain.   ACTIVITY LIMITATIONS: lifting, standing, squatting, sleeping, stairs, transfers, and locomotion level  PARTICIPATION LIMITATIONS: driving, shopping, community activity, and yard work  PERSONAL FACTORS: 3+ comorbidities: Hypertension, diabetes (type 1), spina bifida, and osteoarthritis  are also affecting patient's functional outcome.   REHAB POTENTIAL: Excellent  CLINICAL DECISION MAKING: Stable/uncomplicated  EVALUATION COMPLEXITY: Low   GOALS: Goals reviewed with patient? Yes  LONG TERM GOALS: Target date: 05/24/23  Patient will be independent with his HEP.  Baseline:  Goal status: MET  2.  Patient will be able to demonstrate at least 120 degrees of right knee flexion for improved function navigating stairs.  Baseline:  Goal status: On going   115 degrees  3.  Patient will improve his right knee extension within 5 degrees of neutral for improved gait mechanics.  Baseline:  Goal status: On going   4.  Patient will be able to navigate at least 4 steps with a reciprocal pattern for improved household mobility.  Baseline:  Goal status: Partially MET  5.  Patient will be able to ambulate at least 80 feet without an assistive and no significant gait deviations for improved household mobility.  Baseline:  Goal status: MET  PLAN:  PT FREQUENCY: 2x/week  PT DURATION: 4 weeks  PLANNED INTERVENTIONS: Therapeutic exercises, Therapeutic activity, Neuromuscular re-education, Balance training, Gait training, Patient/Family education, Self Care, Joint mobilization, Stair training, Electrical stimulation, Cryotherapy, Moist heat, Vasopneumatic device, Manual therapy, and Re-evaluation  PLAN FOR NEXT SESSION:    recumbent bike, squatting, lunges, lower extremity strengthening, gait training, manual therapy,  and modalities as needed   Gurley Climer,CHRIS, PTA 05/29/2023, 3:28 PM

## 2023-05-31 ENCOUNTER — Encounter: Payer: Self-pay | Admitting: *Deleted

## 2023-05-31 ENCOUNTER — Ambulatory Visit: Payer: Medicare Other | Admitting: *Deleted

## 2023-05-31 DIAGNOSIS — M25661 Stiffness of right knee, not elsewhere classified: Secondary | ICD-10-CM | POA: Diagnosis not present

## 2023-05-31 DIAGNOSIS — R600 Localized edema: Secondary | ICD-10-CM

## 2023-05-31 DIAGNOSIS — M25561 Pain in right knee: Secondary | ICD-10-CM

## 2023-05-31 NOTE — Therapy (Signed)
OUTPATIENT PHYSICAL THERAPY LOWER EXTREMITY TREATMENT   Patient Name: Grant Richardson. MRN: [id number] DOB:1946-07-23, 77 y.o., male Today's Date: 05/31/2023  END OF SESSION:  PT End of Session - 05/31/23 1500     Visit Number 11    Number of Visits 14    Date for PT Re-Evaluation 07/06/23    PT Start Time 1430    PT Stop Time 1525    PT Time Calculation (min) 55 min             Past Medical History:  Diagnosis Date   Diabetes mellitus without complication (HCC)    Hyperlipidemia    Self-catheterizes urinary bladder    Spina bifida (HCC)    Past Surgical History:  Procedure Laterality Date   KNEE ARTHROSCOPY Right    Patient Active Problem List   Diagnosis Date Noted   Decubitus ulcer of left perineal ischial region, stage 2 (HCC) 11/21/2016   Spina bifida (HCC) 11/21/2016   Recurrent UTI 06/09/2016   Unilateral primary osteoarthritis, left knee 05/18/2016   Primary osteoarthritis of both knees 01/27/2016   Liver hemangioma 07/15/2013   Neurogenic bladder 10/10/2011   Tethered spinal cord (HCC) 10/10/2011   Hypertension 10/09/2011   Type 1 diabetes mellitus (HCC) 10/09/2011   HLD (hyperlipidemia) 05/18/2011    PCP: Rebekah Chesterfield, NP  REFERRING PROVIDER: Levonne Lapping, MD   REFERRING DIAG: Aftercare following joint replacement surgery; Presence of right artificial knee joint   THERAPY DIAG:  Stiffness of right knee, not elsewhere classified  Acute pain of right knee  Localized edema  Rationale for Evaluation and Treatment: Rehabilitation  ONSET DATE: 04/12/23  SUBJECTIVE:   SUBJECTIVE STATEMENT:  Pain about a 2-3/10 medial side of knee is sore.  Doing good today  PERTINENT HISTORY: Hypertension, diabetes (type 1), spina bifida, and osteoarthritis PAIN:  Are you having pain? Yes: NPRS scale: Varies/10 Pain location: right anterior knee Pain description: constant dull pain with some ache and soreness Aggravating factors:  exercise Relieving factors: ice, medication, and compression  PRECAUTIONS: None  RED FLAGS: None   WEIGHT BEARING RESTRICTIONS: No  FALLS:  Has patient fallen in last 6 months? No  LIVING ENVIRONMENT: Lives with: lives with their spouse Lives in: House/apartment Stairs: Yes: External: 6 steps; can reach both; step to pattern Has following equipment at home: Single point cane  OCCUPATION: retired   PLOF: Independent  PATIENT GOALS: be able to get back in his shop (be able to lift up to 40-50 pounds), yard work, and be able to walk without a cane  NEXT MD VISIT: 05/28/23  OBJECTIVE:   PATIENT SURVEYS:  FOTO 62.13  COGNITION: Overall cognitive status: Within functional limits for tasks assessed     SENSATION: Patient reports no numbness or tingling  EDEMA:  Circumferential: Left tibiofemoral joint line: 40.3 cm Right tibiofemoral joint line: 36.5 cm   PALPATION: TTP: right quadriceps, medial and lateral joint line, popliteal fossa, and patella   LOWER EXTREMITY ROM:  Active ROM Right eval Left eval Right 05/01/23  Hip flexion     Hip extension     Hip abduction     Hip adduction     Hip internal rotation     Hip external rotation     Knee flexion 105/108 (PROM) 122 120 post-stretching  Knee extension 8 5   Ankle dorsiflexion     Ankle plantarflexion     Ankle inversion     Ankle eversion      (Blank  rows = not tested)  LOWER EXTREMITY MMT: not tested due to surgical condition  GAIT: Assistive device utilized: Single point cane Level of assistance: Modified independence Comments: decreased gait speed and stride length with right knee flexed in stance phase   TODAY'S TREATMENT:                                                                                                                              DATE:                       05/31/23:                                     EXERCISE LOG   RT knee  Exercise Repetitions and Resistance Comments   Recumbent bike Level 4-5  x 15  minutes progressing to seat 3.   Nustep       Knee ext 20# x 3 minutes   Ham curls 40# x 3 minutes   Leg press  2 plates x .   Seat  7   12in box lunge X15  RT knee flexion stretch   Rocker board calf stretch X 3 mins   Manual PROM for extension  Vasopneumatic on low x 10 minutes.  05/10/23:                                   EXERCISE LOG  Exercise Repetitions and Resistance Comments  Recumbent bike Progressing to seat 4 over 16 minutes.     Knee ext 10# x 3 minutes   Ham curls 40# x 3 minutes           STW/M including IASTM x 8 minutes to patient's right knee superior incisional site f/b  LE elevation and vasopneumatic x 13 minutes on medium.     05/08/23:                                    EXERCISE LOG  Exercise Repetitions and Resistance Comments  Recumbent bike Level 3 x 15 minutes starting a seat 6 and ending 4.   Knee ext 10# x 3 minutes   Ham curls 30# x 3 minutes   Leg Press 2 plates x 3 minutes       LE elevation and vasopneumatic to patient's right knee x 20 minutes.   PATIENT EDUCATION:  Education details: See below. Person educated: Patient Education method: Explanation, demo Education comprehension: verbalized understanding, handout  HOME EXERCISE PROGRAM: HOME EXERCISE PROGRAM Created by [name] Sep 26th, 2024 View at [email or web address] using code: [email or web address]  Page 1 of 1 1 Exercise PRONE QUAD STRETCH WITH BELT OR STRAP Start by lying on your stomach with  a strap or 2 belts linked together and looped it around your affected side ankle. Next, use the belt to pull the knee into a bent position allowing for a stretch as shown. Repeat 5 Times Hold 1 Minute Complete 1 Set Perform 3 Times a Day  ASSESSMENT:  CLINICAL IMPRESSION: Pt arrived today doing good with RT knee. He was able to continue with LE strengthening and ROM progression stretches. RT knee ROM progression to 115 degrees today. Vaso end of  session.  OBJECTIVE IMPAIRMENTS: Abnormal gait, decreased activity tolerance, decreased ROM, decreased strength, increased edema, and pain.   ACTIVITY LIMITATIONS: lifting, standing, squatting, sleeping, stairs, transfers, and locomotion level  PARTICIPATION LIMITATIONS: driving, shopping, community activity, and yard work  PERSONAL FACTORS: 3+ comorbidities: Hypertension, diabetes (type 1), spina bifida, and osteoarthritis  are also affecting patient's functional outcome.   REHAB POTENTIAL: Excellent  CLINICAL DECISION MAKING: Stable/uncomplicated  EVALUATION COMPLEXITY: Low   GOALS: Goals reviewed with patient? Yes  LONG TERM GOALS: Target date: 05/24/23  Patient will be independent with his HEP.  Baseline:  Goal status: MET  2.  Patient will be able to demonstrate at least 120 degrees of right knee flexion for improved function navigating stairs.  Baseline:  Goal status: On going   115 degrees  3.  Patient will improve his right knee extension within 5 degrees of neutral for improved gait mechanics.  Baseline:  Goal status: On going   4.  Patient will be able to navigate at least 4 steps with a reciprocal pattern for improved household mobility.  Baseline:  Goal status: Partially MET  5.  Patient will be able to ambulate at least 80 feet without an assistive and no significant gait deviations for improved household mobility.  Baseline:  Goal status: MET  PLAN:  PT FREQUENCY: 2x/week  PT DURATION: 4 weeks  PLANNED INTERVENTIONS: Therapeutic exercises, Therapeutic activity, Neuromuscular re-education, Balance training, Gait training, Patient/Family education, Self Care, Joint mobilization, Stair training, Electrical stimulation, Cryotherapy, Moist heat, Vasopneumatic device, Manual therapy, and Re-evaluation  PLAN FOR NEXT SESSION:    recumbent bike, squatting, lunges, lower extremity strengthening, gait training, manual therapy, and modalities as  needed   Jarrel Knoke,CHRIS, PTA 05/31/2023, 6:12 PM

## 2023-06-05 ENCOUNTER — Ambulatory Visit: Payer: Medicare Other | Admitting: Physical Therapy

## 2023-06-05 DIAGNOSIS — M25561 Pain in right knee: Secondary | ICD-10-CM

## 2023-06-05 DIAGNOSIS — M25661 Stiffness of right knee, not elsewhere classified: Secondary | ICD-10-CM

## 2023-06-05 DIAGNOSIS — R600 Localized edema: Secondary | ICD-10-CM

## 2023-06-05 NOTE — Therapy (Signed)
OUTPATIENT PHYSICAL THERAPY LOWER EXTREMITY TREATMENT   Patient Name: Grant Richardson. MRN: [id number] DOB:1946/08/03, 77 y.o., male Today's Date: 06/05/2023  END OF SESSION:  PT End of Session - 06/05/23 1424     Visit Number 12    Number of Visits 14    Date for PT Re-Evaluation 07/06/23    PT Start Time 0222    PT Stop Time 0308    PT Time Calculation (min) 46 min    Activity Tolerance Patient tolerated treatment well    Behavior During Therapy [facility] for tasks assessed/performed             Past Medical History:  Diagnosis Date   Diabetes mellitus without complication (HCC)    Hyperlipidemia    Self-catheterizes urinary bladder    Spina bifida (HCC)    Past Surgical History:  Procedure Laterality Date   KNEE ARTHROSCOPY Right    Patient Active Problem List   Diagnosis Date Noted   Decubitus ulcer of left perineal ischial region, stage 2 (HCC) 11/21/2016   Spina bifida (HCC) 11/21/2016   Recurrent UTI 06/09/2016   Unilateral primary osteoarthritis, left knee 05/18/2016   Primary osteoarthritis of both knees 01/27/2016   Liver hemangioma 07/15/2013   Neurogenic bladder 10/10/2011   Tethered spinal cord (HCC) 10/10/2011   Hypertension 10/09/2011   Type 1 diabetes mellitus (HCC) 10/09/2011   HLD (hyperlipidemia) 05/18/2011    PCP: Rebekah Chesterfield, NP  REFERRING PROVIDER: Levonne Lapping, MD   REFERRING DIAG: Aftercare following joint replacement surgery; Presence of right artificial knee joint   THERAPY DIAG:  Stiffness of right knee, not elsewhere classified  Acute pain of right knee  Localized edema  Rationale for Evaluation and Treatment: Rehabilitation  ONSET DATE: 04/12/23  SUBJECTIVE:   SUBJECTIVE STATEMENT:  Dr was pleased.    PERTINENT HISTORY: Hypertension, diabetes (type 1), spina bifida, and osteoarthritis PAIN:  Are you having pain? Yes: NPRS scale: Varies/10 Pain location: right anterior knee Pain description: constant dull  pain with some ache and soreness Aggravating factors: exercise Relieving factors: ice, medication, and compression  PRECAUTIONS: None  RED FLAGS: None   WEIGHT BEARING RESTRICTIONS: No  FALLS:  Has patient fallen in last 6 months? No  LIVING ENVIRONMENT: Lives with: lives with their spouse Lives in: House/apartment Stairs: Yes: External: 6 steps; can reach both; step to pattern Has following equipment at home: Single point cane  OCCUPATION: retired   PLOF: Independent  PATIENT GOALS: be able to get back in his shop (be able to lift up to 40-50 pounds), yard work, and be able to walk without a cane  NEXT MD VISIT: 05/28/23  OBJECTIVE:   PATIENT SURVEYS:  FOTO 62.13  COGNITION: Overall cognitive status: Within functional limits for tasks assessed     SENSATION: Patient reports no numbness or tingling  EDEMA:  Circumferential: Left tibiofemoral joint line: 40.3 cm Right tibiofemoral joint line: 36.5 cm   PALPATION: TTP: right quadriceps, medial and lateral joint line, popliteal fossa, and patella   LOWER EXTREMITY ROM:  Active ROM Right eval Left eval Right 05/01/23  Hip flexion     Hip extension     Hip abduction     Hip adduction     Hip internal rotation     Hip external rotation     Knee flexion 105/108 (PROM) 122 120 post-stretching  Knee extension 8 5   Ankle dorsiflexion     Ankle plantarflexion     Ankle inversion  Ankle eversion      (Blank rows = not tested)  LOWER EXTREMITY MMT: not tested due to surgical condition  GAIT: Assistive device utilized: Single point cane Level of assistance: Modified independence Comments: decreased gait speed and stride length with right knee flexed in stance phase   TODAY'S TREATMENT:                                                                                                                              DATE:     06/05/23:                                     EXERCISE LOG  Exercise  Repetitions and Resistance Comments  Recumbent bike Level 3 x 15 minutes   Knee ext 20# x 3 minutes   Ham curls 40# x 3 minutes   Leg Press 3 plates x 3 minutes       In supine:  PROM to patient tolerance x 6 minutes into flexion and extension f/b  LE elevation and vasopneumatic x 10 minutes.                        05/31/23:                                     EXERCISE LOG   RT knee  Exercise Repetitions and Resistance Comments  Recumbent bike Level 4-5  x 15  minutes progressing to seat 3.   Nustep       Knee ext 20# x 3 minutes   Ham curls 40# x 3 minutes   Leg press  2 plates x .   Seat  7   12in box lunge X15  RT knee flexion stretch   Rocker board calf stretch X 3 mins   Manual PROM for extension  Vasopneumatic on low x 10 minutes.    PATIENT EDUCATION:  Education details: See below. Person educated: Patient Education method: Explanation, demo Education comprehension: verbalized understanding, handout  HOME EXERCISE PROGRAM: HOME EXERCISE PROGRAM Created by [place] Janeece Blok Sep 26th, 2024 View at [email or web address] using code: [email or web address]  Page 1 of 1 1 Exercise PRONE QUAD STRETCH WITH BELT OR STRAP Start by lying on your stomach with a strap or 2 belts linked together and looped it around your affected side ankle. Next, use the belt to pull the knee into a bent position allowing for a stretch as shown. Repeat 5 Times Hold 1 Minute Complete 1 Set Perform 3 Times a Day  ASSESSMENT:  CLINICAL IMPRESSION: Patient is doing very well and his FOTO score has improved to 85. He tolerated an increase in leg press exercise without complaint.  OBJECTIVE IMPAIRMENTS: Abnormal gait, decreased activity tolerance, decreased ROM, decreased strength, increased edema, and  pain.   ACTIVITY LIMITATIONS: lifting, standing, squatting, sleeping, stairs, transfers, and locomotion level  PARTICIPATION LIMITATIONS: driving, shopping, community activity, and yard  work  PERSONAL FACTORS: 3+ comorbidities: Hypertension, diabetes (type 1), spina bifida, and osteoarthritis  are also affecting patient's functional outcome.   REHAB POTENTIAL: Excellent  CLINICAL DECISION MAKING: Stable/uncomplicated  EVALUATION COMPLEXITY: Low   GOALS: Goals reviewed with patient? Yes  LONG TERM GOALS: Target date: 05/24/23  Patient will be independent with his HEP.  Baseline:  Goal status: MET  2.  Patient will be able to demonstrate at least 120 degrees of right knee flexion for improved function navigating stairs.  Baseline:  Goal status: On going   115 degrees  3.  Patient will improve his right knee extension within 5 degrees of neutral for improved gait mechanics.  Baseline:  Goal status: On going   4.  Patient will be able to navigate at least 4 steps with a reciprocal pattern for improved household mobility.  Baseline:  Goal status: Partially MET  5.  Patient will be able to ambulate at least 80 feet without an assistive and no significant gait deviations for improved household mobility.  Baseline:  Goal status: MET  PLAN:  PT FREQUENCY: 2x/week  PT DURATION: 4 weeks  PLANNED INTERVENTIONS: Therapeutic exercises, Therapeutic activity, Neuromuscular re-education, Balance training, Gait training, Patient/Family education, Self Care, Joint mobilization, Stair training, Electrical stimulation, Cryotherapy, Moist heat, Vasopneumatic device, Manual therapy, and Re-evaluation  PLAN FOR NEXT SESSION:    recumbent bike, squatting, lunges, lower extremity strengthening, gait training, manual therapy, and modalities as needed   Dorin [place], [place], PT 06/05/2023, 3:29 PM

## 2023-06-07 ENCOUNTER — Ambulatory Visit: Payer: Medicare Other | Admitting: *Deleted

## 2023-06-07 ENCOUNTER — Encounter: Payer: Self-pay | Admitting: *Deleted

## 2023-06-07 DIAGNOSIS — M25661 Stiffness of right knee, not elsewhere classified: Secondary | ICD-10-CM

## 2023-06-07 DIAGNOSIS — R600 Localized edema: Secondary | ICD-10-CM

## 2023-06-07 DIAGNOSIS — M25561 Pain in right knee: Secondary | ICD-10-CM

## 2023-06-07 NOTE — Therapy (Signed)
OUTPATIENT PHYSICAL THERAPY LOWER EXTREMITY TREATMENT   Patient Name: Grant Richardson. MRN: [id number] DOB:1946/05/18, 77 y.o., male Today's Date: 06/07/2023  END OF SESSION:  PT End of Session - 06/07/23 1455     Visit Number 13    Number of Visits 14    Date for PT Re-Evaluation 07/06/23    PT Start Time 1415    PT Stop Time 1505    PT Time Calculation (min) 50 min             Past Medical History:  Diagnosis Date   Diabetes mellitus without complication (HCC)    Hyperlipidemia    Self-catheterizes urinary bladder    Spina bifida (HCC)    Past Surgical History:  Procedure Laterality Date   KNEE ARTHROSCOPY Right    Patient Active Problem List   Diagnosis Date Noted   Decubitus ulcer of left perineal ischial region, stage 2 (HCC) 11/21/2016   Spina bifida (HCC) 11/21/2016   Recurrent UTI 06/09/2016   Unilateral primary osteoarthritis, left knee 05/18/2016   Primary osteoarthritis of both knees 01/27/2016   Liver hemangioma 07/15/2013   Neurogenic bladder 10/10/2011   Tethered spinal cord (HCC) 10/10/2011   Hypertension 10/09/2011   Type 1 diabetes mellitus (HCC) 10/09/2011   HLD (hyperlipidemia) 05/18/2011    PCP: Rebekah Chesterfield, NP  REFERRING PROVIDER: Levonne Lapping, MD   REFERRING DIAG: Aftercare following joint replacement surgery; Presence of right artificial knee joint   THERAPY DIAG:  Stiffness of right knee, not elsewhere classified  Acute pain of right knee  Localized edema  Rationale for Evaluation and Treatment: Rehabilitation  ONSET DATE: 04/12/23  SUBJECTIVE:   SUBJECTIVE STATEMENT:  Dr was pleased.  DC next week  PERTINENT HISTORY: Hypertension, diabetes (type 1), spina bifida, and osteoarthritis PAIN:  Are you having pain? Yes: NPRS scale: Varies/10 Pain location: right anterior knee Pain description: constant dull pain with some ache and soreness Aggravating factors: exercise Relieving factors: ice, medication, and  compression  PRECAUTIONS: None  RED FLAGS: None   WEIGHT BEARING RESTRICTIONS: No  FALLS:  Has patient fallen in last 6 months? No  LIVING ENVIRONMENT: Lives with: lives with their spouse Lives in: House/apartment Stairs: Yes: External: 6 steps; can reach both; step to pattern Has following equipment at home: Single point cane  OCCUPATION: retired   PLOF: Independent  PATIENT GOALS: be able to get back in his shop (be able to lift up to 40-50 pounds), yard work, and be able to walk without a cane  NEXT MD VISIT: 05/28/23  OBJECTIVE:   PATIENT SURVEYS:  FOTO 62.13  COGNITION: Overall cognitive status: Within functional limits for tasks assessed     SENSATION: Patient reports no numbness or tingling  EDEMA:  Circumferential: Left tibiofemoral joint line: 40.3 cm Right tibiofemoral joint line: 36.5 cm   PALPATION: TTP: right quadriceps, medial and lateral joint line, popliteal fossa, and patella   LOWER EXTREMITY ROM:  Active ROM Right eval Left eval Right 05/01/23  Hip flexion     Hip extension     Hip abduction     Hip adduction     Hip internal rotation     Hip external rotation     Knee flexion 105/108 (PROM) 122 120 post-stretching  Knee extension 8 5   Ankle dorsiflexion     Ankle plantarflexion     Ankle inversion     Ankle eversion      (Blank rows = not tested)  LOWER EXTREMITY  MMT: not tested due to surgical condition  GAIT: Assistive device utilized: Single point cane Level of assistance: Modified independence Comments: decreased gait speed and stride length with right knee flexed in stance phase   TODAY'S TREATMENT:                                                                                                                              DATE:     06/07/23:                                     EXERCISE LOG  Exercise Repetitions and Resistance Comments  Recumbent bike Level 3 x 15 minutes   Knee ext 20# x 4 minutes   Ham curls  40# x 4 minutes   Leg Press 3 plates x 3 minutes       In supine:  PROM to patient tolerance into flexion and extension  LE elevation and vasopneumatic x 10 minutes.                        05/31/23:                                     EXERCISE LOG   RT knee  Exercise Repetitions and Resistance Comments  Recumbent bike Level 4-5  x 15  minutes progressing to seat 3.   Nustep       Knee ext 20# x 3 minutes   Ham curls 40# x 3 minutes   Leg press  2 plates x .   Seat  7   12in box lunge X15  RT knee flexion stretch   Rocker board calf stretch X 3 mins   Manual PROM for extension  Vasopneumatic on low x 10 minutes.    PATIENT EDUCATION:  Education details: See below. Person educated: Patient Education method: Explanation, demo Education comprehension: verbalized understanding, handout  HOME EXERCISE PROGRAM: HOME EXERCISE PROGRAM Created by [name] Sep 26th, 2024 View at [email or web address] using code: [email or web address]  Page 1 of 1 1 Exercise PRONE QUAD STRETCH WITH BELT OR STRAP Start by lying on your stomach with a strap or 2 belts linked together and looped it around your affected side ankle. Next, use the belt to pull the knee into a bent position allowing for a stretch as shown. Repeat 5 Times Hold 1 Minute Complete 1 Set Perform 3 Times a Day  ASSESSMENT:  CLINICAL IMPRESSION: Patient is doing very well and was able to continue with RT knee/LE strengthening as well as PROM for increased flexion and extension. Flexion to 115 degrees today  OBJECTIVE IMPAIRMENTS: Abnormal gait, decreased activity tolerance, decreased ROM, decreased strength, increased edema, and pain.   ACTIVITY LIMITATIONS: lifting, standing, squatting, sleeping, stairs, transfers, and locomotion level  PARTICIPATION LIMITATIONS: driving, shopping, community activity, and yard work  PERSONAL FACTORS: 3+ comorbidities: Hypertension, diabetes (type 1), spina bifida, and  osteoarthritis  are also affecting patient's functional outcome.   REHAB POTENTIAL: Excellent  CLINICAL DECISION MAKING: Stable/uncomplicated  EVALUATION COMPLEXITY: Low   GOALS: Goals reviewed with patient? Yes  LONG TERM GOALS: Target date: 05/24/23  Patient will be independent with his HEP.  Baseline:  Goal status: MET  2.  Patient will be able to demonstrate at least 120 degrees of right knee flexion for improved function navigating stairs.  Baseline:  Goal status: On going   115 degrees  3.  Patient will improve his right knee extension within 5 degrees of neutral for improved gait mechanics.  Baseline:  Goal status: On going   4.  Patient will be able to navigate at least 4 steps with a reciprocal pattern for improved household mobility.  Baseline:  Goal status: Partially MET  5.  Patient will be able to ambulate at least 80 feet without an assistive and no significant gait deviations for improved household mobility.  Baseline:  Goal status: MET  PLAN:  PT FREQUENCY: 2x/week  PT DURATION: 4 weeks  PLANNED INTERVENTIONS: Therapeutic exercises, Therapeutic activity, Neuromuscular re-education, Balance training, Gait training, Patient/Family education, Self Care, Joint mobilization, Stair training, Electrical stimulation, Cryotherapy, Moist heat, Vasopneumatic device, Manual therapy, and Re-evaluation  PLAN FOR NEXT SESSION:    recumbent bike, squatting, lunges, lower extremity strengthening, gait training, manual therapy, and modalities as needed   Lauralie Blacksher,CHRIS, PTA 06/07/2023, 3:29 PM

## 2023-06-12 ENCOUNTER — Encounter: Payer: Self-pay | Admitting: Physical Therapy

## 2023-06-12 ENCOUNTER — Ambulatory Visit: Payer: Medicare Other | Attending: Student | Admitting: Physical Therapy

## 2023-06-12 DIAGNOSIS — R600 Localized edema: Secondary | ICD-10-CM | POA: Insufficient documentation

## 2023-06-12 DIAGNOSIS — M25661 Stiffness of right knee, not elsewhere classified: Secondary | ICD-10-CM | POA: Insufficient documentation

## 2023-06-12 DIAGNOSIS — M25561 Pain in right knee: Secondary | ICD-10-CM | POA: Diagnosis present

## 2023-06-12 NOTE — Therapy (Signed)
OUTPATIENT PHYSICAL THERAPY LOWER EXTREMITY TREATMENT   Patient Name: Grant Richardson. MRN: [id number] DOB:10/20/1945, 77 y.o., male Today's Date: 06/12/2023  END OF SESSION:  PT End of Session - 06/12/23 1504     Visit Number 14    Number of Visits 14    Date for PT Re-Evaluation 07/06/23    PT Start Time 0230    PT Stop Time 0315    PT Time Calculation (min) 45 min    Activity Tolerance Patient tolerated treatment well    Behavior During Therapy [facility] for tasks assessed/performed              Past Medical History:  Diagnosis Date   Diabetes mellitus without complication (HCC)    Hyperlipidemia    Self-catheterizes urinary bladder    Spina bifida (HCC)    Past Surgical History:  Procedure Laterality Date   KNEE ARTHROSCOPY Right    Patient Active Problem List   Diagnosis Date Noted   Decubitus ulcer of left perineal ischial region, stage 2 (HCC) 11/21/2016   Spina bifida (HCC) 11/21/2016   Recurrent UTI 06/09/2016   Unilateral primary osteoarthritis, left knee 05/18/2016   Primary osteoarthritis of both knees 01/27/2016   Liver hemangioma 07/15/2013   Neurogenic bladder 10/10/2011   Tethered spinal cord (HCC) 10/10/2011   Hypertension 10/09/2011   Type 1 diabetes mellitus (HCC) 10/09/2011   HLD (hyperlipidemia) 05/18/2011    PCP: Rebekah Chesterfield, NP  REFERRING PROVIDER: Levonne Lapping, MD   REFERRING DIAG: Aftercare following joint replacement surgery; Presence of right artificial knee joint   THERAPY DIAG:  Stiffness of right knee, not elsewhere classified  Acute pain of right knee  Localized edema  Rationale for Evaluation and Treatment: Rehabilitation  ONSET DATE: 04/12/23  SUBJECTIVE:   SUBJECTIVE STATEMENT: Doing good.  Helped move chairs at church and in and out of a trailer for about 4 hours.  PERTINENT HISTORY: Hypertension, diabetes (type 1), spina bifida, and osteoarthritis PAIN:  Are you having pain? Yes: NPRS scale:  4/10 Pain location: right anterior knee Pain description: constant dull pain with some ache and soreness Aggravating factors: exercise Relieving factors: ice, medication, and compression  PRECAUTIONS: None  RED FLAGS: None   WEIGHT BEARING RESTRICTIONS: No  FALLS:  Has patient fallen in last 6 months? No  LIVING ENVIRONMENT: Lives with: lives with their spouse Lives in: House/apartment Stairs: Yes: External: 6 steps; can reach both; step to pattern Has following equipment at home: Single point cane  OCCUPATION: retired   PLOF: Independent  PATIENT GOALS: be able to get back in his shop (be able to lift up to 40-50 pounds), yard work, and be able to walk without a cane  NEXT MD VISIT: 05/28/23  OBJECTIVE:   PATIENT SURVEYS:  FOTO 62.13  COGNITION: Overall cognitive status: Within functional limits for tasks assessed     SENSATION: Patient reports no numbness or tingling  EDEMA:  Circumferential: Left tibiofemoral joint line: 40.3 cm Right tibiofemoral joint line: 36.5 cm   PALPATION: TTP: right quadriceps, medial and lateral joint line, popliteal fossa, and patella   LOWER EXTREMITY ROM:  Active ROM Right eval Left eval Right 05/01/23  Hip flexion     Hip extension     Hip abduction     Hip adduction     Hip internal rotation     Hip external rotation     Knee flexion 105/108 (PROM) 122 120 post-stretching  Knee extension 8 5   Ankle dorsiflexion  Ankle plantarflexion     Ankle inversion     Ankle eversion      (Blank rows = not tested)  LOWER EXTREMITY MMT: not tested due to surgical condition  GAIT: Assistive device utilized: Single point cane Level of assistance: Modified independence Comments: decreased gait speed and stride length with right knee flexed in stance phase   TODAY'S TREATMENT:                                                                                                                              DATE:     06/12/23:                                     EXERCISE LOG  Exercise Repetitions and Resistance Comments  Recumbent bike Level 3 x 15 minutes    Knee ext 10# x 3 minutes.   Ham curls 40# x 4 minutes   Leg press 3 plates x 3 minutes.        LE elevation and vasopneumatic x 12 minutes.     06/07/23:                                     EXERCISE LOG  Exercise Repetitions and Resistance Comments  Recumbent bike Level 3 x 15 minutes   Knee ext 20# x 4 minutes   Ham curls 40# x 4 minutes   Leg Press 3 plates x 3 minutes       In supine:  PROM to patient tolerance into flexion and extension  LE elevation and vasopneumatic x 10 minutes.                        05/31/23:                                     EXERCISE LOG   RT knee  Exercise Repetitions and Resistance Comments  Recumbent bike Level 4-5  x 15  minutes progressing to seat 3.   Nustep       Knee ext 20# x 3 minutes   Ham curls 40# x 3 minutes   Leg press  2 plates x .   Seat  7   12in box lunge X15  RT knee flexion stretch   Rocker board calf stretch X 3 mins   Manual PROM for extension  Vasopneumatic on low x 10 minutes.    PATIENT EDUCATION:  Education details: See below. Person educated: Patient Education method: Explanation, demo Education comprehension: verbalized understanding, handout  HOME EXERCISE PROGRAM: HOME EXERCISE PROGRAM Created by [place] Jeb Schloemer Sep 26th, 2024 View at [email or web address] using code: [email or web address]  Page 1 of 1 1 Exercise  PRONE QUAD STRETCH WITH BELT OR STRAP Start by lying on your stomach with a strap or 2 belts linked together and looped it around your affected side ankle. Next, use the belt to pull the knee into a bent position allowing for a stretch as shown. Repeat 5 Times Hold 1 Minute Complete 1 Set Perform 3 Times a Day  ASSESSMENT:  CLINICAL IMPRESSION: See below. OBJECTIVE IMPAIRMENTS: Abnormal gait, decreased activity tolerance, decreased  ROM, decreased strength, increased edema, and pain.   ACTIVITY LIMITATIONS: lifting, standing, squatting, sleeping, stairs, transfers, and locomotion level  PARTICIPATION LIMITATIONS: driving, shopping, community activity, and yard work  PERSONAL FACTORS: 3+ comorbidities: Hypertension, diabetes (type 1), spina bifida, and osteoarthritis  are also affecting patient's functional outcome.   REHAB POTENTIAL: Excellent  CLINICAL DECISION MAKING: Stable/uncomplicated  EVALUATION COMPLEXITY: Low   GOALS: Goals reviewed with patient? Yes  LONG TERM GOALS: Target date: 05/24/23  Patient will be independent with his HEP.  Baseline:  Goal status: MET  2.  Patient will be able to demonstrate at least 120 degrees of right knee flexion for improved function navigating stairs.  Baseline:  Goal status: On going   115 degrees  3.  Patient will improve his right knee extension within 5 degrees of neutral for improved gait mechanics.  Baseline:  Goal status: -5 degrees (06/12/23).  4.  Patient will be able to navigate at least 4 steps with a reciprocal pattern for improved household mobility.  Baseline:  Goal status: Partially MET  5.  Patient will be able to ambulate at least 80 feet without an assistive and no significant gait deviations for improved household mobility.  Baseline:  Goal status: MET  PLAN:  PT FREQUENCY: 2x/week  PT DURATION: 4 weeks  PLANNED INTERVENTIONS: Therapeutic exercises, Therapeutic activity, Neuromuscular re-education, Balance training, Gait training, Patient/Family education, Self Care, Joint mobilization, Stair training, Electrical stimulation, Cryotherapy, Moist heat, Vasopneumatic device, Manual therapy, and Re-evaluation  PLAN FOR NEXT SESSION:    recumbent bike, squatting, lunges, lower extremity strengthening, gait training, manual therapy, and modalities as needed  PHYSICAL THERAPY DISCHARGE SUMMARY  Visits from Start of Care: 14.  Current  functional level related to goals / functional outcomes: See above.   Remaining deficits: Patient has done extremely with progress toward goals as above.   Education / Equipment: HEP.  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     Patient agrees to discharge. Patient goals were met. Patient is being discharged due to meeting the stated rehab goals.    Izrael Peak, [place], PT 06/12/2023, 3:23 PM

## 2023-06-14 ENCOUNTER — Encounter: Payer: Medicare Other | Admitting: *Deleted

## 2023-11-12 ENCOUNTER — Ambulatory Visit: Admitting: Physical Therapy

## 2023-11-20 ENCOUNTER — Ambulatory Visit: Attending: Student

## 2023-11-20 DIAGNOSIS — M25662 Stiffness of left knee, not elsewhere classified: Secondary | ICD-10-CM | POA: Diagnosis present

## 2023-11-20 DIAGNOSIS — M25562 Pain in left knee: Secondary | ICD-10-CM | POA: Insufficient documentation

## 2023-11-20 DIAGNOSIS — R600 Localized edema: Secondary | ICD-10-CM | POA: Diagnosis present

## 2023-11-20 NOTE — Therapy (Addendum)
 OUTPATIENT PHYSICAL THERAPY LOWER EXTREMITY EVALUATION   Patient Name: Grant Richardson. MRN: [id number] DOB:1945-11-15, 78 y.o., male Today's Date: 11/20/2023  END OF SESSION:  PT End of Session - 11/20/23 1342     Visit Number 1    Number of Visits 8    Date for PT Re-Evaluation 12/21/23    PT Start Time 1300    PT Stop Time 1325    PT Time Calculation (min) 25 min    Behavior During Therapy [facility] for tasks assessed/performed             Past Medical History:  Diagnosis Date   Diabetes mellitus without complication (HCC)    Hyperlipidemia    Self-catheterizes urinary bladder    Spina bifida (HCC)    Past Surgical History:  Procedure Laterality Date   KNEE ARTHROSCOPY Right    Patient Active Problem List   Diagnosis Date Noted   Decubitus ulcer of left perineal ischial region, stage 2 (HCC) 11/21/2016   Spina bifida (HCC) 11/21/2016   Recurrent UTI 06/09/2016   Unilateral primary osteoarthritis, left knee 05/18/2016   Primary osteoarthritis of both knees 01/27/2016   Liver hemangioma 07/15/2013   Neurogenic bladder 10/10/2011   Tethered spinal cord (HCC) 10/10/2011   Hypertension 10/09/2011   Type 1 diabetes mellitus (HCC) 10/09/2011   HLD (hyperlipidemia) 05/18/2011    PCP: Rebekah Chesterfield, NP  REFERRING PROVIDER: Levonne Lapping, MD  REFERRING DIAG: M17.12 (ICD-10-CM) - Unilateral primary osteoarthritis, left knee  THERAPY DIAG:  Acute pain of left knee  Stiffness of left knee, not elsewhere classified  Localized edema  Rationale for Evaluation and Treatment: Rehabilitation  ONSET DATE: 11/08/2023  SUBJECTIVE:   SUBJECTIVE STATEMENT: Patient was referred to physical therapy following a left total knee arthoplasty on 11/08/23. He described the pain as a 3-4/10 at rest and 8/10 at its worst. Pain is most aggravated with walking as well as during sit-to-stand transfers. He reported adherence with surgeon-prescribed HEP, performing it 3 times a  day.    PERTINENT HISTORY: Spina bifida, primary osteoarthritis, hypertension, type 1 diabetes mellitus,  right total knee replacement  PAIN:  Are you having pain? Yes: NPRS scale: 3-4/10 at time of treatment, 8/10 at its worst.   Pain location: left knee  Pain description: ache, throbbing Aggravating factors: walking, sit-to-stand transfers Relieving factors: exercising, cold pack, pain medication  PRECAUTIONS: None  RED FLAGS: None   WEIGHT BEARING RESTRICTIONS: No  FALLS:  Has patient fallen in last 6 months? No  LIVING ENVIRONMENT: Lives with: lives with their spouse Lives in: House/apartment Stairs:  Yes: External: 4 front porch steps; can reach both and External: 7 back porch steps; can reach both  Has following equipment at home: Single point cane  OCCUPATION: retired  PLOF: Independent  PATIENT GOALS: improve movement, improve strength, stand longer, ride bike.   NEXT MD VISIT: 11/22/23  OBJECTIVE:  Note: Objective measures were completed at Evaluation unless otherwise noted.  PATIENT SURVEYS:  LEFS 46/80  COGNITION: Overall cognitive status:  WFL      SENSATION: WFL  EDEMA:  Circumferential: Right tibiofemoral joint line: 35.5 cm Left tibiofemoral joint line: 38.5 cm   PALPATION: TTP: left quadriceps, medial and lateral joint line, patella   LOWER EXTREMITY ROM:  Active ROM Right eval Left eval  Hip flexion    Hip extension    Hip abduction    Hip adduction    Hip internal rotation    Hip external rotation  Knee flexion 105 degrees with stiffness 96 degrees with stiffness  PROM: 101 degrees  Knee extension 4 degrees from neutral 4 degrees from neutral  Ankle dorsiflexion    Ankle plantarflexion    Ankle inversion    Ankle eversion     (Blank rows = not tested)  LOWER EXTREMITY MMT: not tested due to surgical condition  GAIT: Assistive device utilized: Single point cane Level of assistance: Modified independence Comments:  Patient demonstrated decreased stride length and gait speed with step-through gait pattern  PATIENT EDUCATION:  Education details: Patient educated on objective findings, POC, prognosis and goals for therapy.  Person educated: Patient Education method: Explanation Education comprehension: verbalized understanding  HOME EXERCISE PROGRAM:  ASSESSMENT:  CLINICAL IMPRESSION:  Patient is a 78 year old male who was seen today for a physical therapy evaluation following a left TKA on 11/08/23. Objective findings of left TKA include limited and stiff active knee flexion, limited and stiff passive knee flexion, TTP over patella, quadriceps, and bilateral joint lines. Circumferential measurement of left knee was 38.5 cm as compared to 35.5 cm on the right, indicating post operative swelling. He did not report any pain with any AROM/PROM assessed. He exhibited no signs or symptoms of a post operative complication. He reported adherence to his surgeon-prescribed HEP, performing it three times daily, and expressed his intent to remain consistent. Patient would benefit from skilled physical therapy to address impairments, promote independence in ADL's, and facilitate safe and effective return to his prior level of function.    OBJECTIVE IMPAIRMENTS: decreased activity tolerance, decreased endurance, decreased mobility, difficulty walking, decreased ROM, decreased strength, hypomobility, increased edema, impaired flexibility, and pain.   ACTIVITY LIMITATIONS: carrying, lifting, bending, sitting, standing, squatting, and stairs  PARTICIPATION LIMITATIONS:  bike riding  PERSONAL FACTORS: 3+ comorbidities: Spina bifida, primary osteoarthritis hypertension,type 1 diabetes mellitus  are also affecting patient's functional outcome.   REHAB POTENTIAL: Good  CLINICAL DECISION MAKING: Stable/uncomplicated  EVALUATION COMPLEXITY: Low   GOALS: Goals reviewed with patient? No   LONG TERM GOALS: Target date:  12/21/23  The patient will be independent with their HEP.  Baseline: Goal status: INITIAL  2.  Patient's LEFS score will increase from 46 to at least 58, which will enable greater confidence in performance ADL's.   Baseline:  Goal status: INITIAL  3.  Patient's pain at its worst will decrease from 8/10 to 6/10 which will enable him to walk for longer periods of time.   Baseline:  Goal status: INITIAL  4. Patient's knee flexion will increase to at least 120 degrees which will improve his ability to ascend and descend stairs.  Baseline:   Goal status: INITIAL  4. Patient's will be able to ambulate with no significant gait deviations.  Baseline:   Goal status: INITIAL   PLAN:  PT FREQUENCY: 2x/week  PT DURATION: 4 weeks  PLANNED INTERVENTIONS: 97164- PT Re-evaluation, 97110-Therapeutic exercises, 97530- Therapeutic activity, 97112- Neuromuscular re-education, 97535- Self Care, 46962- Manual therapy, [phone]- Gait training, [phone]- Electrical stimulation (unattended), 97016- Vasopneumatic device, Patient/Family education, Stair training, Joint mobilization, Scar mobilization, Compression bandaging, Cryotherapy, and Moist heat  PLAN FOR NEXT SESSION: Nustep, knee AAROM, isometrics and concentric hip and knee strengthening exercises, joint mobilizations, modalities as needed    Willis Holquin, Student-PT 11/20/2023, 5:56 PM

## 2023-11-20 NOTE — Addendum Note (Signed)
 Addended by: Lane Pinon on: 11/20/2023 06:20 PM   Modules accepted: Orders

## 2023-11-27 ENCOUNTER — Ambulatory Visit

## 2023-11-27 DIAGNOSIS — R600 Localized edema: Secondary | ICD-10-CM

## 2023-11-27 DIAGNOSIS — M25562 Pain in left knee: Secondary | ICD-10-CM | POA: Diagnosis not present

## 2023-11-27 DIAGNOSIS — M25662 Stiffness of left knee, not elsewhere classified: Secondary | ICD-10-CM

## 2023-11-27 NOTE — Therapy (Addendum)
 OUTPATIENT PHYSICAL THERAPY LOWER EXTREMITY TREATMENT   Patient Name: Grant Richardson. MRN: [id number] DOB:May 03, 1946, 78 y.o., male Today's Date: 11/27/2023  END OF SESSION:  PT End of Session - 11/27/23 1343     Visit Number 2    Number of Visits 8    Date for PT Re-Evaluation 12/21/23    PT Start Time 1342    PT Stop Time 1500    PT Time Calculation (min) 78 min    Activity Tolerance Patient tolerated treatment well    Behavior During Therapy WFL for tasks assessed/performed             Past Medical History:  Diagnosis Date   Diabetes mellitus without complication (HCC)    Hyperlipidemia    Self-catheterizes urinary bladder    Spina bifida (HCC)    Past Surgical History:  Procedure Laterality Date   KNEE ARTHROSCOPY Right    Patient Active Problem List   Diagnosis Date Noted   Decubitus ulcer of left perineal ischial region, stage 2 (HCC) 11/21/2016   Spina bifida (HCC) 11/21/2016   Recurrent UTI 06/09/2016   Unilateral primary osteoarthritis, left knee 05/18/2016   Primary osteoarthritis of both knees 01/27/2016   Liver hemangioma 07/15/2013   Neurogenic bladder 10/10/2011   Tethered spinal cord (HCC) 10/10/2011   Hypertension 10/09/2011   Type 1 diabetes mellitus (HCC) 10/09/2011   HLD (hyperlipidemia) 05/18/2011    PCP: Lenn Quint, NP  REFERRING PROVIDER: Sharol Decamp, MD  REFERRING DIAG: M17.12 (ICD-10-CM) - Unilateral primary osteoarthritis, left knee  THERAPY DIAG:  Acute pain of left knee  Stiffness of left knee, not elsewhere classified  Localized edema  Rationale for Evaluation and Treatment: Rehabilitation  ONSET DATE: 11/08/2023  SUBJECTIVE:   SUBJECTIVE STATEMENT:  Patient reports some soreness and swelling in left knee today. Pain was 2/10 at the start of treatment session.   PERTINENT HISTORY: Spina bifida, primary osteoarthritis, hypertension, type 1 diabetes mellitus,  right total knee replacement   PAIN:   Are you having pain? Yes: NPRS scale: 2/10 at time of treatment   Pain location: left knee  Pain description: ache, throbbing Aggravating factors: walking, sit-to-stand transfers Relieving factors: exercising, cold pack, pain medication  PRECAUTIONS: None  RED FLAGS: None   WEIGHT BEARING RESTRICTIONS: No  FALLS:  Has patient fallen in last 6 months? No  LIVING ENVIRONMENT: Lives with: lives with their spouse Lives in: House/apartment Stairs:  Yes: External: 4 front porch steps; can reach both and External: 7 back porch steps; can reach both  Has following equipment at home: Single point cane  OCCUPATION: retired  PLOF: Independent  PATIENT GOALS: improve movement, improve strength, stand longer, ride bike.   NEXT MD VISIT: 11/22/23  OBJECTIVE:  Note: Objective measures were completed at Evaluation unless otherwise noted.  PATIENT SURVEYS:  LEFS 46/80  COGNITION: Overall cognitive status:  WFL      SENSATION: WFL  EDEMA:  Circumferential: Right tibiofemoral joint line: 35.5 cm Left tibiofemoral joint line: 38.5 cm   PALPATION: TTP: left quadriceps, medial and lateral joint line, patella   LOWER EXTREMITY ROM:  Active ROM Right eval Left eval  Hip flexion    Hip extension    Hip abduction    Hip adduction    Hip internal rotation    Hip external rotation    Knee flexion 105 degrees with stiffness 96 degrees with stiffness  PROM: 101 degrees  Knee extension 4 degrees from neutral 4 degrees from neutral  Ankle dorsiflexion    Ankle plantarflexion    Ankle inversion    Ankle eversion     (Blank rows = not tested)  LOWER EXTREMITY MMT: not tested due to surgical condition  GAIT: Assistive device utilized: Single point cane Level of assistance: Modified independence Comments: Patient demonstrated decreased stride length and gait speed with step-through gait pattern  TREATMENT:                                     04/22/ 25 EXERCISE LOG    Exercise Repetitions and Resistance Comments  Nustep  Level 4 10 minutes  TE  Hip abduction   3 sets of 12 reps red band  TE  Seated march   3 sets of 12 reps red band  NE  Standing TKE   3 sets of 8 reps  NE  Standing knee bend  4 minutes 14" step  TE  Rocker  2 minutes   TE  Seated toe raises  3 sets of 12 reps  TA  Ball squeezes   3 sets 8 reps  NE  Seated heel raises   3 sets of 12 reps  TA   Blank cell = exercise not performed today   Manual Therapy Soft Tissue Mobilization: left knee, STW/M to left distal quad and IT band to decrease pain and tone.    Modalities  Date:  Vaso: Knee, 34 degrees; low pressure, 15 mins, Pain and Edema    PATIENT EDUCATION:  Education details: Patient educated on objective findings, POC, prognosis and goals for therapy.  Person educated: Patient Education method: Explanation Education comprehension: verbalized understanding  HOME EXERCISE PROGRAM:  ASSESSMENT:  CLINICAL IMPRESSION:  The patient responded well to treatment. He was provided verbal cueing with hip abduction, seated march, standing TKE, toe and heel raise exercises to ensure proper muscle engagement and body mechanics.  STW/M performed to left distal quad and IT band to decrease pain and tone.  Pt given tennis ball for performance of self STW at home.  Normal responses to vaso noted upon removal.  He reported improved mobility at the end of the treatment session. Patient will benefit from skilled PT in order address impairments and decrease pain while improving ability functional mobility.   OBJECTIVE IMPAIRMENTS: decreased activity tolerance, decreased endurance, decreased mobility, difficulty walking, decreased ROM, decreased strength, hypomobility, increased edema, impaired flexibility, and pain.   ACTIVITY LIMITATIONS: carrying, lifting, bending, sitting, standing, squatting, and stairs  PARTICIPATION LIMITATIONS:  bike riding  PERSONAL FACTORS: 3+ comorbidities: Spina  bifida, primary osteoarthritis hypertension,type 1 diabetes mellitus  are also affecting patient's functional outcome.   REHAB POTENTIAL: Good  CLINICAL DECISION MAKING: Stable/uncomplicated  EVALUATION COMPLEXITY: Low   GOALS: Goals reviewed with patient? No   LONG TERM GOALS: Target date: 12/21/23  The patient will be independent with their HEP.  Baseline: Goal status: INITIAL  2.  Patient's LEFS score will increase from 46 to at least 58, which will enable greater confidence in performance ADL's.   Baseline:  Goal status: INITIAL  3.  Patient's pain at its worst will decrease from 8/10 to 6/10 which will enable him to walk for longer periods of time.   Baseline:  Goal status: INITIAL  4. Patient's knee flexion will increase to at least 120 degrees which will improve his ability to ascend and descend stairs.  Baseline:   Goal status: INITIAL  4.  Patient's will be able to ambulate with no significant gait deviations.  Baseline:   Goal status: INITIAL   PLAN:  PT FREQUENCY: 2x/week  PT DURATION: 4 weeks  PLANNED INTERVENTIONS: 97164- PT Re-evaluation, 97110-Therapeutic exercises, 97530- Therapeutic activity, [id number]- Neuromuscular re-education, 97535- Self Care, [id number]- Manual therapy, [phone]- Gait training, [phone]- Electrical stimulation (unattended), 97016- Vasopneumatic device, Patient/Family education, Stair training, Joint mobilization, Scar mobilization, Compression bandaging, Cryotherapy, and Moist heat  PLAN FOR NEXT SESSION: Nustep, progress knee AAROM, continue concentric hip, knee and lower leg strengthening exercises, joint mobilizations, modalities as needed.    Deryl Flora, PTA 11/27/2023, 3:20 PM

## 2023-11-29 ENCOUNTER — Ambulatory Visit: Admitting: Physical Therapy

## 2023-11-29 DIAGNOSIS — M25562 Pain in left knee: Secondary | ICD-10-CM

## 2023-11-29 DIAGNOSIS — R600 Localized edema: Secondary | ICD-10-CM

## 2023-11-29 DIAGNOSIS — M25662 Stiffness of left knee, not elsewhere classified: Secondary | ICD-10-CM

## 2023-11-29 NOTE — Therapy (Signed)
 OUTPATIENT PHYSICAL THERAPY LOWER EXTREMITY TREATMENT   Patient Name: Grant Richardson. MRN: [id number] DOB:02/21/1946, 78 y.o., male Today's Date: 11/29/2023  END OF SESSION:  PT End of Session - 11/29/23 1358     Visit Number 3    Number of Visits 8    Date for PT Re-Evaluation 12/21/23    PT Start Time 0145    PT Stop Time 0231    PT Time Calculation (min) 46 min    Activity Tolerance Patient tolerated treatment well    Behavior During Therapy [facility] for tasks assessed/performed             Past Medical History:  Diagnosis Date   Diabetes mellitus without complication (HCC)    Hyperlipidemia    Self-catheterizes urinary bladder    Spina bifida (HCC)    Past Surgical History:  Procedure Laterality Date   KNEE ARTHROSCOPY Right    Patient Active Problem List   Diagnosis Date Noted   Decubitus ulcer of left perineal ischial region, stage 2 (HCC) 11/21/2016   Spina bifida (HCC) 11/21/2016   Recurrent UTI 06/09/2016   Unilateral primary osteoarthritis, left knee 05/18/2016   Primary osteoarthritis of both knees 01/27/2016   Liver hemangioma 07/15/2013   Neurogenic bladder 10/10/2011   Tethered spinal cord (HCC) 10/10/2011   Hypertension 10/09/2011   Type 1 diabetes mellitus (HCC) 10/09/2011   HLD (hyperlipidemia) 05/18/2011    PCP: Lenn Quint, NP  REFERRING PROVIDER: Sharol Decamp, MD  REFERRING DIAG: M17.12 (ICD-10-CM) - Unilateral primary osteoarthritis, left knee  THERAPY DIAG:  Acute pain of left knee  Stiffness of left knee, not elsewhere classified  Localized edema  Rationale for Evaluation and Treatment: Rehabilitation  ONSET DATE: 11/08/2023  SUBJECTIVE:   SUBJECTIVE STATEMENT: Pain about a 4. Patient reports some soreness and swelling in left knee today. Pain was 2/10 at the start of treatment session.   PERTINENT HISTORY: Spina bifida, primary osteoarthritis, hypertension, type 1 diabetes mellitus,  right total knee  replacement   PAIN:  Are you having pain? Yes: NPRS scale: 4/10 at time of treatment   Pain location: left knee  Pain description: ache, throbbing Aggravating factors: walking, sit-to-stand transfers Relieving factors: exercising, cold pack, pain medication  PRECAUTIONS: None  RED FLAGS: None   WEIGHT BEARING RESTRICTIONS: No  FALLS:  Has patient fallen in last 6 months? No  LIVING ENVIRONMENT: Lives with: lives with their spouse Lives in: House/apartment Stairs:  Yes: External: 4 front porch steps; can reach both and External: 7 back porch steps; can reach both  Has following equipment at home: Single point cane  OCCUPATION: retired  PLOF: Independent  PATIENT GOALS: improve movement, improve strength, stand longer, ride bike.   NEXT MD VISIT: 11/22/23  OBJECTIVE:  Note: Objective measures were completed at Evaluation unless otherwise noted.  PATIENT SURVEYS:  LEFS 46/80  COGNITION: Overall cognitive status:  WFL      SENSATION: WFL  EDEMA:  Circumferential: Right tibiofemoral joint line: 35.5 cm Left tibiofemoral joint line: 38.5 cm   PALPATION: TTP: left quadriceps, medial and lateral joint line, patella   LOWER EXTREMITY ROM:  Active ROM Right eval Left eval  Hip flexion    Hip extension    Hip abduction    Hip adduction    Hip internal rotation    Hip external rotation    Knee flexion 105 degrees with stiffness 96 degrees with stiffness  PROM: 101 degrees  Knee extension 4 degrees from neutral 4 degrees  from neutral  Ankle dorsiflexion    Ankle plantarflexion    Ankle inversion    Ankle eversion     (Blank rows = not tested)  LOWER EXTREMITY MMT: not tested due to surgical condition  GAIT: Assistive device utilized: Single point cane Level of assistance: Modified independence Comments: Patient demonstrated decreased stride length and gait speed with step-through gait pattern  TREATMENT:   11/29/23:                                        EXERCISE LOG  Exercise Repetitions and Resistance Comments  Recumbent bike  Level 3 x 15 minutes at seat 5.   Knee ext  10# x 3 minutes   Ham curls 30# x 3 minutes            Left distal quad and ITB x 8 minutes f/b  LE elevation and vasopneumatic x 12 minutes.                                     04/22/ 25 EXERCISE LOG   Exercise Repetitions and Resistance Comments  Nustep  Level 4 10 minutes  TE  Hip abduction   3 sets of 12 reps red band  TE  Seated march   3 sets of 12 reps red band  NE  Standing TKE   3 sets of 8 reps  NE  Standing knee bend  4 minutes 14" step  TE  Rocker  2 minutes   TE  Seated toe raises  3 sets of 12 reps  TA  Ball squeezes   3 sets 8 reps  NE  Seated heel raises   3 sets of 12 reps  TA   Blank cell = exercise not performed today   Manual Therapy Soft Tissue Mobilization: left knee, STW/M to left distal quad and IT band to decrease pain and tone.    Modalities  Date:  Vaso: Knee, 34 degrees; low pressure, 15 mins, Pain and Edema    PATIENT EDUCATION:  Education details: Patient educated on objective findings, POC, prognosis and goals for therapy.  Person educated: Patient Education method: Explanation Education comprehension: verbalized understanding  HOME EXERCISE PROGRAM:  ASSESSMENT:  CLINICAL IMPRESSION:  Patient doing very well and progressed to recumbent bike and weight machines for knee ext and ham curls.    OBJECTIVE IMPAIRMENTS: decreased activity tolerance, decreased endurance, decreased mobility, difficulty walking, decreased ROM, decreased strength, hypomobility, increased edema, impaired flexibility, and pain.   ACTIVITY LIMITATIONS: carrying, lifting, bending, sitting, standing, squatting, and stairs  PARTICIPATION LIMITATIONS:  bike riding  PERSONAL FACTORS: 3+ comorbidities: Spina bifida, primary osteoarthritis hypertension,type 1 diabetes mellitus  are also affecting patient's functional outcome.   REHAB  POTENTIAL: Good  CLINICAL DECISION MAKING: Stable/uncomplicated  EVALUATION COMPLEXITY: Low   GOALS: Goals reviewed with patient? No   LONG TERM GOALS: Target date: 12/21/23  The patient will be independent with their HEP.  Baseline: Goal status: INITIAL  2.  Patient's LEFS score will increase from 46 to at least 58, which will enable greater confidence in performance ADL's.   Baseline:  Goal status: INITIAL  3.  Patient's pain at its worst will decrease from 8/10 to 6/10 which will enable him to walk for longer periods of time.   Baseline:  Goal status:  INITIAL  4. Patient's knee flexion will increase to at least 120 degrees which will improve his ability to ascend and descend stairs.  Baseline:   Goal status: INITIAL  4. Patient's will be able to ambulate with no significant gait deviations.  Baseline:   Goal status: INITIAL   PLAN:  PT FREQUENCY: 2x/week  PT DURATION: 4 weeks  PLANNED INTERVENTIONS: 97164- PT Re-evaluation, 97110-Therapeutic exercises, 97530- Therapeutic activity, [id number]- Neuromuscular re-education, 97535- Self Care, 09811- Manual therapy, [phone]- Gait training, [phone]- Electrical stimulation (unattended), 97016- Vasopneumatic device, Patient/Family education, Stair training, Joint mobilization, Scar mobilization, Compression bandaging, Cryotherapy, and Moist heat  PLAN FOR NEXT SESSION: Nustep, progress knee AAROM, continue concentric hip, knee and lower leg strengthening exercises, joint mobilizations, modalities as needed.    Kiondre Grenz, [place], PT 11/29/2023, 2:34 PM

## 2023-12-04 ENCOUNTER — Ambulatory Visit

## 2023-12-04 DIAGNOSIS — M25562 Pain in left knee: Secondary | ICD-10-CM

## 2023-12-04 DIAGNOSIS — M25662 Stiffness of left knee, not elsewhere classified: Secondary | ICD-10-CM

## 2023-12-04 DIAGNOSIS — R600 Localized edema: Secondary | ICD-10-CM

## 2023-12-04 NOTE — Therapy (Signed)
 OUTPATIENT PHYSICAL THERAPY LOWER EXTREMITY TREATMENT   Patient Name: Grant Richardson. MRN: [id number] DOB:May 04, 1946, 78 y.o., male Today's Date: 12/04/2023  END OF SESSION:  PT End of Session - 12/04/23 1435     Visit Number 4    Number of Visits 8    Date for PT Re-Evaluation 12/21/23    PT Start Time 1431    PT Stop Time 1525    PT Time Calculation (min) 54 min    Activity Tolerance Patient tolerated treatment well    Behavior During Therapy WFL for tasks assessed/performed             Past Medical History:  Diagnosis Date   Diabetes mellitus without complication (HCC)    Hyperlipidemia    Self-catheterizes urinary bladder    Spina bifida (HCC)    Past Surgical History:  Procedure Laterality Date   KNEE ARTHROSCOPY Right    Patient Active Problem List   Diagnosis Date Noted   Decubitus ulcer of left perineal ischial region, stage 2 (HCC) 11/21/2016   Spina bifida (HCC) 11/21/2016   Recurrent UTI 06/09/2016   Unilateral primary osteoarthritis, left knee 05/18/2016   Primary osteoarthritis of both knees 01/27/2016   Liver hemangioma 07/15/2013   Neurogenic bladder 10/10/2011   Tethered spinal cord (HCC) 10/10/2011   Hypertension 10/09/2011   Type 1 diabetes mellitus (HCC) 10/09/2011   HLD (hyperlipidemia) 05/18/2011    PCP: Lenn Quint, NP  REFERRING PROVIDER: Sharol Decamp, MD  REFERRING DIAG: M17.12 (ICD-10-CM) - Unilateral primary osteoarthritis, left knee  THERAPY DIAG:  Acute pain of left knee  Stiffness of left knee, not elsewhere classified  Localized edema  Rationale for Evaluation and Treatment: Rehabilitation  ONSET DATE: 11/08/2023  SUBJECTIVE:   SUBJECTIVE STATEMENT: Pain about a 4. Patient reports some soreness and swelling in left knee today. Pain was 2/10 at the start of treatment session.   PERTINENT HISTORY: Spina bifida, primary osteoarthritis, hypertension, type 1 diabetes mellitus,  right total knee  replacement   PAIN:  Are you having pain? Yes: NPRS scale: 4/10 at time of treatment   Pain location: left knee  Pain description: ache, throbbing Aggravating factors: walking, sit-to-stand transfers Relieving factors: exercising, cold pack, pain medication  PRECAUTIONS: None  RED FLAGS: None   WEIGHT BEARING RESTRICTIONS: No  FALLS:  Has patient fallen in last 6 months? No  LIVING ENVIRONMENT: Lives with: lives with their spouse Lives in: House/apartment Stairs:  Yes: External: 4 front porch steps; can reach both and External: 7 back porch steps; can reach both  Has following equipment at home: Single point cane  OCCUPATION: retired  PLOF: Independent  PATIENT GOALS: improve movement, improve strength, stand longer, ride bike.   NEXT MD VISIT: 11/22/23  OBJECTIVE:  Note: Objective measures were completed at Evaluation unless otherwise noted.  PATIENT SURVEYS:  LEFS 46/80  COGNITION: Overall cognitive status:  WFL      SENSATION: WFL  EDEMA:  Circumferential: Right tibiofemoral joint line: 35.5 cm Left tibiofemoral joint line: 38.5 cm   PALPATION: TTP: left quadriceps, medial and lateral joint line, patella   LOWER EXTREMITY ROM:  Active ROM Right eval Left eval  Hip flexion    Hip extension    Hip abduction    Hip adduction    Hip internal rotation    Hip external rotation    Knee flexion 105 degrees with stiffness 96 degrees with stiffness  PROM: 101 degrees  Knee extension 4 degrees from neutral 4 degrees  from neutral  Ankle dorsiflexion    Ankle plantarflexion    Ankle inversion    Ankle eversion     (Blank rows = not tested)  LOWER EXTREMITY MMT: not tested due to surgical condition  GAIT: Assistive device utilized: Single point cane Level of assistance: Modified independence Comments: Patient demonstrated decreased stride length and gait speed with step-through gait pattern  TREATMENT:   12/04/23:                                     EXERCISE LOG  Exercise Repetitions and Resistance Comments  Recumbent bike  Level 4 x 15 minutes at seat 4   Knee ext  10# x 4 minutes   Ham curls 30# x 4 minutes    Leg Press 2 plates; seat 7 x 3 mins        Manual Therapy Soft Tissue Mobilization: left knee, STW/M to left medial knee region to decrease pain and tone.    Modalities  Date:  Vaso: Knee, 34 degrees; low pressure, 9 mins, Pain                                      04/22/ 25 EXERCISE LOG   Exercise Repetitions and Resistance Comments  Nustep  Level 4 10 minutes  TE  Hip abduction   3 sets of 12 reps red band  TE  Seated march   3 sets of 12 reps red band  NE  Standing TKE   3 sets of 8 reps  NE  Standing knee bend  4 minutes 14" step  TE  Rocker  2 minutes   TE  Seated toe raises  3 sets of 12 reps  TA  Ball squeezes   3 sets 8 reps  NE  Seated heel raises   3 sets of 12 reps  TA   Blank cell = exercise not performed today   Manual Therapy Soft Tissue Mobilization: left knee, STW/M to left distal quad and IT band to decrease pain and tone.    Modalities  Date:  Vaso: Knee, 34 degrees; low pressure, 15 mins, Pain and Edema    PATIENT EDUCATION:  Education details: Patient educated on objective findings, POC, prognosis and goals for therapy.  Person educated: Patient Education method: Explanation Education comprehension: verbalized understanding  HOME EXERCISE PROGRAM:  ASSESSMENT:  CLINICAL IMPRESSION:  Pt arrives for today's treatment session reporting 3/10 left knee pain.  Pt able to progress to seat 4 on the recumbent bike without any issue.  Pt able to tolerate increased time with cybex knee flexion and extension today without any discomfort.  Pt also introduced to cybex leg press with min cues for eccentric control required with good carry over. STW/M to medical left knee to decrease pain and tone.  Normal responses to vaso noted upon removal.  Pt reported decreased pain at completion of today's  treatment session.   OBJECTIVE IMPAIRMENTS: decreased activity tolerance, decreased endurance, decreased mobility, difficulty walking, decreased ROM, decreased strength, hypomobility, increased edema, impaired flexibility, and pain.   ACTIVITY LIMITATIONS: carrying, lifting, bending, sitting, standing, squatting, and stairs  PARTICIPATION LIMITATIONS:  bike riding  PERSONAL FACTORS: 3+ comorbidities: Spina bifida, primary osteoarthritis hypertension,type 1 diabetes mellitus  are also affecting patient's functional outcome.   REHAB POTENTIAL: Good  CLINICAL DECISION MAKING:  Stable/uncomplicated  EVALUATION COMPLEXITY: Low   GOALS: Goals reviewed with patient? No   LONG TERM GOALS: Target date: 12/21/23  The patient will be independent with their HEP.  Baseline: Goal status: INITIAL  2.  Patient's LEFS score will increase from 46 to at least 58, which will enable greater confidence in performance ADL's.   Baseline:  Goal status: INITIAL  3.  Patient's pain at its worst will decrease from 8/10 to 6/10 which will enable him to walk for longer periods of time.   Baseline:  Goal status: INITIAL  4. Patient's knee flexion will increase to at least 120 degrees which will improve his ability to ascend and descend stairs.  Baseline:   Goal status: INITIAL  4. Patient's will be able to ambulate with no significant gait deviations.  Baseline:   Goal status: INITIAL  PLAN:  PT FREQUENCY: 2x/week  PT DURATION: 4 weeks  PLANNED INTERVENTIONS: 97164- PT Re-evaluation, 97110-Therapeutic exercises, 97530- Therapeutic activity, [id number]- Neuromuscular re-education, 97535- Self Care, 01027- Manual therapy, [phone]- Gait training, [phone]- Electrical stimulation (unattended), 97016- Vasopneumatic device, Patient/Family education, Stair training, Joint mobilization, Scar mobilization, Compression bandaging, Cryotherapy, and Moist heat  PLAN FOR NEXT SESSION: Nustep, progress knee AAROM,  continue concentric hip, knee and lower leg strengthening exercises, joint mobilizations, modalities as needed.    Deryl Flora, PTA 12/04/2023, 3:31 PM

## 2023-12-06 ENCOUNTER — Ambulatory Visit: Attending: Student | Admitting: Physical Therapy

## 2023-12-06 DIAGNOSIS — M25662 Stiffness of left knee, not elsewhere classified: Secondary | ICD-10-CM | POA: Insufficient documentation

## 2023-12-06 DIAGNOSIS — M25562 Pain in left knee: Secondary | ICD-10-CM | POA: Insufficient documentation

## 2023-12-06 DIAGNOSIS — R600 Localized edema: Secondary | ICD-10-CM | POA: Insufficient documentation

## 2023-12-06 NOTE — Therapy (Signed)
 OUTPATIENT PHYSICAL THERAPY LOWER EXTREMITY TREATMENT   Patient Name: Grant Richardson. MRN: [id number] DOB:[birth year], 78 y.o., male Today's Date: 12/06/2023  END OF SESSION:  PT End of Session - 12/06/23 1526     Visit Number 5    Number of Visits 8    Date for PT Re-Evaluation 12/21/23    PT Start Time 0230    PT Stop Time 0312    PT Time Calculation (min) 42 min    Activity Tolerance Patient tolerated treatment well    Behavior During Therapy [facility] for tasks assessed/performed             Past Medical History:  Diagnosis Date   Diabetes mellitus without complication (HCC)    Hyperlipidemia    Self-catheterizes urinary bladder    Spina bifida (HCC)    Past Surgical History:  Procedure Laterality Date   KNEE ARTHROSCOPY Right    Patient Active Problem List   Diagnosis Date Noted   Decubitus ulcer of left perineal ischial region, stage 2 (HCC) 11/21/2016   Spina bifida (HCC) 11/21/2016   Recurrent UTI 06/09/2016   Unilateral primary osteoarthritis, left knee 05/18/2016   Primary osteoarthritis of both knees 01/27/2016   Liver hemangioma 07/15/2013   Neurogenic bladder 10/10/2011   Tethered spinal cord (HCC) 10/10/2011   Hypertension 10/09/2011   Type 1 diabetes mellitus (HCC) 10/09/2011   HLD (hyperlipidemia) 05/18/2011    PCP: Lenn Quint, NP  REFERRING PROVIDER: Sharol Decamp, MD  REFERRING DIAG: M17.12 (ICD-10-CM) - Unilateral primary osteoarthritis, left knee  THERAPY DIAG:  Acute pain of left knee  Stiffness of left knee, not elsewhere classified  Localized edema  Rationale for Evaluation and Treatment: Rehabilitation  ONSET DATE: 11/08/2023  SUBJECTIVE:   SUBJECTIVE STATEMENT: Pain about a 4.  PERTINENT HISTORY: Spina bifida, primary osteoarthritis, hypertension, type 1 diabetes mellitus,  right total knee replacement   PAIN:  Are you having pain? Yes: NPRS scale: 4/10 at time of treatment   Pain location: left knee  Pain  description: ache, throbbing Aggravating factors: walking, sit-to-stand transfers Relieving factors: exercising, cold pack, pain medication  PRECAUTIONS: None  RED FLAGS: None   WEIGHT BEARING RESTRICTIONS: No  FALLS:  Has patient fallen in last 6 months? No  LIVING ENVIRONMENT: Lives with: lives with their spouse Lives in: House/apartment Stairs:  Yes: External: 4 front porch steps; can reach both and External: 7 back porch steps; can reach both  Has following equipment at home: Single point cane  OCCUPATION: retired  PLOF: Independent  PATIENT GOALS: improve movement, improve strength, stand longer, ride bike.   NEXT MD VISIT: 11/22/23  OBJECTIVE:  Note: Objective measures were completed at Evaluation unless otherwise noted.  PATIENT SURVEYS:  LEFS 46/80  COGNITION: Overall cognitive status:  WFL      SENSATION: WFL  EDEMA:  Circumferential: Right tibiofemoral joint line: 35.5 cm Left tibiofemoral joint line: 38.5 cm   PALPATION: TTP: left quadriceps, medial and lateral joint line, patella   LOWER EXTREMITY ROM:  Active ROM Right eval Left eval  Hip flexion    Hip extension    Hip abduction    Hip adduction    Hip internal rotation    Hip external rotation    Knee flexion 105 degrees with stiffness 96 degrees with stiffness  PROM: 101 degrees  Knee extension 4 degrees from neutral 4 degrees from neutral  Ankle dorsiflexion    Ankle plantarflexion    Ankle inversion    Ankle eversion     (  Blank rows = not tested)  LOWER EXTREMITY MMT: not tested due to surgical condition  GAIT: Assistive device utilized: Single point cane Level of assistance: Modified independence Comments: Patient demonstrated decreased stride length and gait speed with step-through gait pattern  TREATMENT:   12/06/23:                                       EXERCISE LOG  Exercise Repetitions and Resistance Comments  Recumbent bike  15 minutes   Knee ext 10# x 3  minutes   Ham curls 30# x 3 minutes           STW/M x 7 minutes f/b Vasopneumatic x 8 minutes.  12/04/23:                                    EXERCISE LOG  Exercise Repetitions and Resistance Comments  Recumbent bike  Level 4 x 15 minutes at seat 4   Knee ext  10# x 4 minutes   Ham curls 30# x 4 minutes    Leg Press 2 plates; seat 7 x 3 mins           PATIENT EDUCATION:  Education details: Patient educated on objective findings, POC, prognosis and goals for therapy.  Person educated: Patient Education method: Explanation Education comprehension: verbalized understanding  HOME EXERCISE PROGRAM:  ASSESSMENT:  CLINICAL IMPRESSION:  Pt arrives for today's treatment session reporting 3/10 left knee pain.  Pt able to progress to seat 4 on the recumbent bike without any issue.  Pt able to tolerate increased time with cybex knee flexion and extension today without any discomfort.  Pt also introduced to cybex leg press with min cues for eccentric control required with good carry over. STW/M to medical left knee to decrease pain and tone.  Normal responses to vaso noted upon removal.  Pt reported decreased pain at completion of today's treatment session.   OBJECTIVE IMPAIRMENTS: decreased activity tolerance, decreased endurance, decreased mobility, difficulty walking, decreased ROM, decreased strength, hypomobility, increased edema, impaired flexibility, and pain.   ACTIVITY LIMITATIONS: carrying, lifting, bending, sitting, standing, squatting, and stairs  PARTICIPATION LIMITATIONS:  bike riding  PERSONAL FACTORS: 3+ comorbidities: Spina bifida, primary osteoarthritis hypertension,type 1 diabetes mellitus  are also affecting patient's functional outcome.   REHAB POTENTIAL: Good  CLINICAL DECISION MAKING: Stable/uncomplicated  EVALUATION COMPLEXITY: Low   GOALS: Goals reviewed with patient? No   LONG TERM GOALS: Target date: 12/21/23  The patient will be independent with their  HEP.  Baseline: Goal status: INITIAL  2.  Patient's LEFS score will increase from 46 to at least 58, which will enable greater confidence in performance ADL's.   Baseline:  Goal status: INITIAL  3.  Patient's pain at its worst will decrease from 8/10 to 6/10 which will enable him to walk for longer periods of time.   Baseline:  Goal status: INITIAL  4. Patient's knee flexion will increase to at least 120 degrees which will improve his ability to ascend and descend stairs.  Baseline:   Goal status: INITIAL  4. Patient's will be able to ambulate with no significant gait deviations.  Baseline:   Goal status: INITIAL  PLAN:  PT FREQUENCY: 2x/week  PT DURATION: 4 weeks  PLANNED INTERVENTIONS: 97164- PT Re-evaluation, 97110-Therapeutic exercises, 97530- Therapeutic activity, [id number]- Neuromuscular re-education,  [id number]- Self Care, [id number]- Manual therapy, [id number]- Gait training, [phone]- Electrical stimulation (unattended), [phone]- Vasopneumatic device, Patient/Family education, Stair training, Joint mobilization, Scar mobilization, Compression bandaging, Cryotherapy, and Moist heat  PLAN FOR NEXT SESSION: Nustep, progress knee AAROM, continue concentric hip, knee and lower leg strengthening exercises, joint mobilizations, modalities as needed.    Karli Wickizer, [place], PT 12/06/2023, 3:55 PM

## 2023-12-11 ENCOUNTER — Encounter: Payer: Self-pay | Admitting: Physical Therapy

## 2023-12-11 ENCOUNTER — Ambulatory Visit: Admitting: Physical Therapy

## 2023-12-11 DIAGNOSIS — M25662 Stiffness of left knee, not elsewhere classified: Secondary | ICD-10-CM

## 2023-12-11 DIAGNOSIS — R600 Localized edema: Secondary | ICD-10-CM

## 2023-12-11 DIAGNOSIS — M25562 Pain in left knee: Secondary | ICD-10-CM

## 2023-12-11 NOTE — Therapy (Signed)
 OUTPATIENT PHYSICAL THERAPY LOWER EXTREMITY TREATMENT   Patient Name: Grant Richardson. MRN: [id number] DOB:17-Oct-1945, 78 y.o., male Today's Date: 12/11/2023  END OF SESSION:  PT End of Session - 12/11/23 1608     Visit Number 6    Number of Visits 8    Date for PT Re-Evaluation 12/21/23    PT Start Time 0400    Activity Tolerance Patient tolerated treatment well    Behavior During Therapy [facility] for tasks assessed/performed             Past Medical History:  Diagnosis Date   Diabetes mellitus without complication (HCC)    Hyperlipidemia    Self-catheterizes urinary bladder    Spina bifida (HCC)    Past Surgical History:  Procedure Laterality Date   KNEE ARTHROSCOPY Right    Patient Active Problem List   Diagnosis Date Noted   Decubitus ulcer of left perineal ischial region, stage 2 (HCC) 11/21/2016   Spina bifida (HCC) 11/21/2016   Recurrent UTI 06/09/2016   Unilateral primary osteoarthritis, left knee 05/18/2016   Primary osteoarthritis of both knees 01/27/2016   Liver hemangioma 07/15/2013   Neurogenic bladder 10/10/2011   Tethered spinal cord (HCC) 10/10/2011   Hypertension 10/09/2011   Type 1 diabetes mellitus (HCC) 10/09/2011   HLD (hyperlipidemia) 05/18/2011    PCP: Lenn Quint, NP  REFERRING PROVIDER: Sharol Decamp, MD  REFERRING DIAG: M17.12 (ICD-10-CM) - Unilateral primary osteoarthritis, left knee  THERAPY DIAG:  Acute pain of left knee  Stiffness of left knee, not elsewhere classified  Localized edema  Rationale for Evaluation and Treatment: Rehabilitation  ONSET DATE: 11/08/2023  SUBJECTIVE:   SUBJECTIVE STATEMENT: Not too bad. Pain a little higher up. PERTINENT HISTORY: Spina bifida, primary osteoarthritis, hypertension, type 1 diabetes mellitus,  right total knee replacement   PAIN:  Are you having pain? Yes: NPRS scale: 4/10 at time of treatment   Pain location: left knee  Pain description: ache, throbbing Aggravating  factors: walking, sit-to-stand transfers Relieving factors: exercising, cold pack, pain medication  PRECAUTIONS: None  RED FLAGS: None   WEIGHT BEARING RESTRICTIONS: No  FALLS:  Has patient fallen in last 6 months? No  LIVING ENVIRONMENT: Lives with: lives with their spouse Lives in: House/apartment Stairs:  Yes: External: 4 front porch steps; can reach both and External: 7 back porch steps; can reach both  Has following equipment at home: Single point cane  OCCUPATION: retired  PLOF: Independent  PATIENT GOALS: improve movement, improve strength, stand longer, ride bike.   NEXT MD VISIT: 11/22/23  OBJECTIVE:  Note: Objective measures were completed at Evaluation unless otherwise noted.  PATIENT SURVEYS:  LEFS 46/80  COGNITION: Overall cognitive status:  WFL      SENSATION: WFL  EDEMA:  Circumferential: Right tibiofemoral joint line: 35.5 cm Left tibiofemoral joint line: 38.5 cm   PALPATION: TTP: left quadriceps, medial and lateral joint line, patella   LOWER EXTREMITY ROM:  Active ROM Right eval Left eval  Hip flexion    Hip extension    Hip abduction    Hip adduction    Hip internal rotation    Hip external rotation    Knee flexion 105 degrees with stiffness 96 degrees with stiffness  PROM: 101 degrees  Knee extension 4 degrees from neutral 4 degrees from neutral  Ankle dorsiflexion    Ankle plantarflexion    Ankle inversion    Ankle eversion     (Blank rows = not tested)  LOWER EXTREMITY MMT:  not tested due to surgical condition  GAIT: Assistive device utilized: Single point cane Level of assistance: Modified independence Comments: Patient demonstrated decreased stride length and gait speed with step-through gait pattern  TREATMENT:   12/11/23:                                     EXERCISE LOG  Exercise Repetitions and Resistance Comments  Recumbent bike Seat 3 x 15 minutes.   Knee ext 10# x 3 minutes   Ham curls  40# x 3 minutes    Leg press 2 plates x 3 minutes       STW/M x 14 minutes to patient's left distal quad/incisional site f/b Vasopneumatic on medium x 10 minutes.    12/06/23:                                       EXERCISE LOG  Exercise Repetitions and Resistance Comments  Recumbent bike  15 minutes   Knee ext 10# x 3 minutes   Ham curls 30# x 3 minutes           STW/M x 7 minutes f/b Vasopneumatic x 8 minutes.  12/04/23:                                    EXERCISE LOG  Exercise Repetitions and Resistance Comments  Recumbent bike  Level 4 x 15 minutes at seat 4   Knee ext  10# x 4 minutes   Ham curls 30# x 4 minutes    Leg Press 2 plates; seat 7 x 3 mins           PATIENT EDUCATION:  Education details: Patient educated on objective findings, POC, prognosis and goals for therapy.  Person educated: Patient Education method: Explanation Education comprehension: verbalized understanding  HOME EXERCISE PROGRAM:  ASSESSMENT:  CLINICAL IMPRESSION:  Patient is doing very well and is highly motivated.  Added leg press which patient performed without complaint.  STW/M performed to his left distal quads/incisional site which was his CC of pain.  Patient felt good after treatment.  OBJECTIVE IMPAIRMENTS: decreased activity tolerance, decreased endurance, decreased mobility, difficulty walking, decreased ROM, decreased strength, hypomobility, increased edema, impaired flexibility, and pain.   ACTIVITY LIMITATIONS: carrying, lifting, bending, sitting, standing, squatting, and stairs  PARTICIPATION LIMITATIONS:  bike riding  PERSONAL FACTORS: 3+ comorbidities: Spina bifida, primary osteoarthritis hypertension,type 1 diabetes mellitus  are also affecting patient's functional outcome.   REHAB POTENTIAL: Good  CLINICAL DECISION MAKING: Stable/uncomplicated  EVALUATION COMPLEXITY: Low   GOALS: Goals reviewed with patient? No   LONG TERM GOALS: Target date: 12/21/23  The patient will be  independent with their HEP.  Baseline: Goal status: INITIAL  2.  Patient's LEFS score will increase from 46 to at least 58, which will enable greater confidence in performance ADL's.   Baseline:  Goal status: INITIAL  3.  Patient's pain at its worst will decrease from 8/10 to 6/10 which will enable him to walk for longer periods of time.   Baseline:  Goal status: INITIAL  4. Patient's knee flexion will increase to at least 120 degrees which will improve his ability to ascend and descend stairs.  Baseline:   Goal status: INITIAL  4.  Patient's will be able to ambulate with no significant gait deviations.  Baseline:   Goal status: INITIAL  PLAN:  PT FREQUENCY: 2x/week  PT DURATION: 4 weeks  PLANNED INTERVENTIONS: 97164- PT Re-evaluation, 97110-Therapeutic exercises, 97530- Therapeutic activity, [id number]- Neuromuscular re-education, 97535- Self Care, 16109- Manual therapy, [phone]- Gait training, [phone]- Electrical stimulation (unattended), 97016- Vasopneumatic device, Patient/Family education, Stair training, Joint mobilization, Scar mobilization, Compression bandaging, Cryotherapy, and Moist heat  PLAN FOR NEXT SESSION: Nustep, progress knee AAROM, continue concentric hip, knee and lower leg strengthening exercises, joint mobilizations, modalities as needed.    Eryk Beavers, [place], PT 12/11/2023, 4:53 PM

## 2023-12-14 ENCOUNTER — Ambulatory Visit: Admitting: Physical Therapy

## 2023-12-14 DIAGNOSIS — M25662 Stiffness of left knee, not elsewhere classified: Secondary | ICD-10-CM

## 2023-12-14 DIAGNOSIS — M25562 Pain in left knee: Secondary | ICD-10-CM | POA: Diagnosis not present

## 2023-12-14 DIAGNOSIS — R600 Localized edema: Secondary | ICD-10-CM

## 2023-12-14 NOTE — Therapy (Signed)
 OUTPATIENT PHYSICAL THERAPY LOWER EXTREMITY TREATMENT   Patient Name: Grant Richardson. MRN: [id number] DOB:12/02/1945, 78 y.o., male Today's Date: 12/14/2023  END OF SESSION:  PT End of Session - 12/14/23 1413     Visit Number 7    Number of Visits 8    Date for PT Re-Evaluation 12/21/23    PT Start Time 1145    PT Stop Time 1233    PT Time Calculation (min) 48 min    Activity Tolerance Patient tolerated treatment well    Behavior During Therapy WFL for tasks assessed/performed             Past Medical History:  Diagnosis Date   Diabetes mellitus without complication (HCC)    Hyperlipidemia    Self-catheterizes urinary bladder    Spina bifida (HCC)    Past Surgical History:  Procedure Laterality Date   KNEE ARTHROSCOPY Right    Patient Active Problem List   Diagnosis Date Noted   Decubitus ulcer of left perineal ischial region, stage 2 (HCC) 11/21/2016   Spina bifida (HCC) 11/21/2016   Recurrent UTI 06/09/2016   Unilateral primary osteoarthritis, left knee 05/18/2016   Primary osteoarthritis of both knees 01/27/2016   Liver hemangioma 07/15/2013   Neurogenic bladder 10/10/2011   Tethered spinal cord (HCC) 10/10/2011   Hypertension 10/09/2011   Type 1 diabetes mellitus (HCC) 10/09/2011   HLD (hyperlipidemia) 05/18/2011    PCP: Lenn Quint, NP  REFERRING PROVIDER: Sharol Decamp, MD  REFERRING DIAG: M17.12 (ICD-10-CM) - Unilateral primary osteoarthritis, left knee  THERAPY DIAG:  Acute pain of left knee  Stiffness of left knee, not elsewhere classified  Localized edema  Rationale for Evaluation and Treatment: Rehabilitation  ONSET DATE: 11/08/2023  SUBJECTIVE:   SUBJECTIVE STATEMENT: Doing more at home.    PERTINENT HISTORY: Spina bifida, primary osteoarthritis, hypertension, type 1 diabetes mellitus,  right total knee replacement   PAIN:  Are you having pain? Yes: NPRS scale: 4/10 at time of treatment   Pain location: left knee   Pain description: ache, throbbing Aggravating factors: walking, sit-to-stand transfers Relieving factors: exercising, cold pack, pain medication  PRECAUTIONS: None  RED FLAGS: None   WEIGHT BEARING RESTRICTIONS: No  FALLS:  Has patient fallen in last 6 months? No  LIVING ENVIRONMENT: Lives with: lives with their spouse Lives in: House/apartment Stairs: Yes: External: 4 front porch steps; can reach both and External: 7 back porch steps; can reach both  Has following equipment at home: Single point cane  OCCUPATION: retired  PLOF: Independent  PATIENT GOALS: improve movement, improve strength, stand longer, ride bike.   NEXT MD VISIT: 11/22/23  OBJECTIVE:  Note: Objective measures were completed at Evaluation unless otherwise noted.  PATIENT SURVEYS:  LEFS 46/80  COGNITION: Overall cognitive status: WFL     SENSATION: WFL  EDEMA:  Circumferential: Right tibiofemoral joint line: 35.5 cm Left tibiofemoral joint line: 38.5 cm   PALPATION: TTP: left quadriceps, medial and lateral joint line, patella   LOWER EXTREMITY ROM:  Active ROM Right eval Left eval  Hip flexion    Hip extension    Hip abduction    Hip adduction    Hip internal rotation    Hip external rotation    Knee flexion 105 degrees with stiffness 96 degrees with stiffness  PROM: 101 degrees  Knee extension 4 degrees from neutral 4 degrees from neutral  Ankle dorsiflexion    Ankle plantarflexion    Ankle inversion    Ankle eversion     (  Blank rows = not tested)  LOWER EXTREMITY MMT: not tested due to surgical condition  GAIT: Assistive device utilized: Single point cane Level of assistance: Modified independence Comments: Patient demonstrated decreased stride length and gait speed with step-through gait pattern  TREATMENT:        12/14/23:   EXERCISE LOG  Exercise Repetitions and Resistance Comments  Recumbent bike Seat 3 x 15 minutes.   Knee ext 10# x 3 minutes   Ham curls  40# x  3 minutes   Leg press 2 plates x 3 minutes       STW/M x 8 minutes to patient's left distal quad/incisional site f/b Vasopneumatic on medium x 10 minutes.     12/11/23:                                     EXERCISE LOG  Exercise Repetitions and Resistance Comments  Recumbent bike Seat 3 x 15 minutes.   Knee ext 10# x 3 minutes   Ham curls  40# x 3 minutes   Leg press 2 plates x 3 minutes       STW/M x 14 minutes to patient's left distal quad/incisional site f/b Vasopneumatic on medium x 10 minutes.    12/06/23:                                       EXERCISE LOG  Exercise Repetitions and Resistance Comments  Recumbent bike  15 minutes   Knee ext 10# x 3 minutes   Ham curls 30# x 3 minutes           STW/M x 7 minutes f/b Vasopneumatic x 8 minutes.  12/04/23:                                    EXERCISE LOG  Exercise Repetitions and Resistance Comments  Recumbent bike  Level 4 x 15 minutes at seat 4   Knee ext  10# x 4 minutes   Ham curls 30# x 4 minutes    Leg Press 2 plates; seat 7 x 3 mins           PATIENT EDUCATION:  Education details: Patient educated on objective findings, POC, prognosis and goals for therapy.  Person educated: Patient Education method: Explanation Education comprehension: verbalized understanding  HOME EXERCISE PROGRAM:  ASSESSMENT:  CLINICAL IMPRESSION:  Patient is very pleased with his progress and is now doing much more around the house.  OBJECTIVE IMPAIRMENTS: decreased activity tolerance, decreased endurance, decreased mobility, difficulty walking, decreased ROM, decreased strength, hypomobility, increased edema, impaired flexibility, and pain.   ACTIVITY LIMITATIONS: carrying, lifting, bending, sitting, standing, squatting, and stairs  PARTICIPATION LIMITATIONS: bike riding  PERSONAL FACTORS: 3+ comorbidities: Spina bifida, primary osteoarthritis hypertension,type 1 diabetes mellitus are also affecting patient's functional outcome.    REHAB POTENTIAL: Good  CLINICAL DECISION MAKING: Stable/uncomplicated  EVALUATION COMPLEXITY: Low   GOALS: Goals reviewed with patient? No   LONG TERM GOALS: Target date: 12/21/23  The patient will be independent with their HEP.  Baseline: Goal status: INITIAL  2.  Patient's LEFS score will increase from 46 to at least 58, which will enable greater confidence in performance ADL's.   Baseline:  Goal status: INITIAL  3.  Patient's pain at its worst will decrease from 8/10 to 6/10 which will enable him to walk for longer periods of time.   Baseline:  Goal status: INITIAL  4. Patient's knee flexion will increase to at least 120 degrees which will improve his ability to ascend and descend stairs.  Baseline:   Goal status: INITIAL  4. Patient's will be able to ambulate with no significant gait deviations.  Baseline:   Goal status: INITIAL  PLAN:  PT FREQUENCY: 2x/week  PT DURATION: 4 weeks  PLANNED INTERVENTIONS: 97164- PT Re-evaluation, 97110-Therapeutic exercises, 97530- Therapeutic activity, [id number]- Neuromuscular re-education, 97535- Self Care, 91478- Manual therapy, [phone]- Gait training, [phone]- Electrical stimulation (unattended), 97016- Vasopneumatic device, Patient/Family education, Stair training, Joint mobilization, Scar mobilization, Compression bandaging, Cryotherapy, and Moist heat  PLAN FOR NEXT SESSION: Nustep, progress knee AAROM, continue concentric hip, knee and lower leg strengthening exercises, joint mobilizations, modalities as needed.    Dayton Sherr, [place], PT 12/14/2023, 2:16 PM

## 2023-12-17 ENCOUNTER — Ambulatory Visit

## 2023-12-17 DIAGNOSIS — R600 Localized edema: Secondary | ICD-10-CM

## 2023-12-17 DIAGNOSIS — M25562 Pain in left knee: Secondary | ICD-10-CM | POA: Diagnosis not present

## 2023-12-17 DIAGNOSIS — M25662 Stiffness of left knee, not elsewhere classified: Secondary | ICD-10-CM

## 2023-12-17 NOTE — Therapy (Signed)
 OUTPATIENT PHYSICAL THERAPY LOWER EXTREMITY TREATMENT   Patient Name: Grant Richardson. MRN: [id number] DOB:[birth year], 78 y.o., male Today's Date: 12/17/2023  END OF SESSION:  PT End of Session - 12/17/23 1400     Visit Number 8    Number of Visits 8    Date for PT Re-Evaluation 12/21/23    PT Start Time 1345    Activity Tolerance Patient tolerated treatment well    Behavior During Therapy [facility] for tasks assessed/performed             Past Medical History:  Diagnosis Date   Diabetes mellitus without complication (HCC)    Hyperlipidemia    Self-catheterizes urinary bladder    Spina bifida (HCC)    Past Surgical History:  Procedure Laterality Date   KNEE ARTHROSCOPY Right    Patient Active Problem List   Diagnosis Date Noted   Decubitus ulcer of left perineal ischial region, stage 2 (HCC) 11/21/2016   Spina bifida (HCC) 11/21/2016   Recurrent UTI 06/09/2016   Unilateral primary osteoarthritis, left knee 05/18/2016   Primary osteoarthritis of both knees 01/27/2016   Liver hemangioma 07/15/2013   Neurogenic bladder 10/10/2011   Tethered spinal cord (HCC) 10/10/2011   Hypertension 10/09/2011   Type 1 diabetes mellitus (HCC) 10/09/2011   HLD (hyperlipidemia) 05/18/2011    PCP: Lenn Quint, NP  REFERRING PROVIDER: Sharol Decamp, MD  REFERRING DIAG: M17.12 (ICD-10-CM) - Unilateral primary osteoarthritis, left knee  THERAPY DIAG:  Acute pain of left knee  Stiffness of left knee, not elsewhere classified  Localized edema  Rationale for Evaluation and Treatment: Rehabilitation  ONSET DATE: 11/08/2023  SUBJECTIVE:   SUBJECTIVE STATEMENT:   Pt denies any pain today.   PERTINENT HISTORY: Spina bifida, primary osteoarthritis, hypertension, type 1 diabetes mellitus,  right total knee replacement   PAIN:  Are you having pain? No  PRECAUTIONS: None  RED FLAGS: None   WEIGHT BEARING RESTRICTIONS: No  FALLS:  Has patient fallen in last 6  months? No  LIVING ENVIRONMENT: Lives with: lives with their spouse Lives in: House/apartment Stairs: Yes: External: 4 front porch steps; can reach both and External: 7 back porch steps; can reach both  Has following equipment at home: Single point cane  OCCUPATION: retired  PLOF: Independent  PATIENT GOALS: improve movement, improve strength, stand longer, ride bike.   NEXT MD VISIT: 11/22/23  OBJECTIVE:  Note: Objective measures were completed at Evaluation unless otherwise noted.  PATIENT SURVEYS:  LEFS 46/80  COGNITION: Overall cognitive status: WFL     SENSATION: WFL  EDEMA:  Circumferential: Right tibiofemoral joint line: 35.5 cm Left tibiofemoral joint line: 38.5 cm   PALPATION: TTP: left quadriceps, medial and lateral joint line, patella   LOWER EXTREMITY ROM:  Active ROM Right eval Left eval  Hip flexion    Hip extension    Hip abduction    Hip adduction    Hip internal rotation    Hip external rotation    Knee flexion 105 degrees with stiffness 96 degrees with stiffness  PROM: 101 degrees  Knee extension 4 degrees from neutral 4 degrees from neutral  Ankle dorsiflexion    Ankle plantarflexion    Ankle inversion    Ankle eversion     (Blank rows = not tested)  LOWER EXTREMITY MMT: not tested due to surgical condition  GAIT: Assistive device utilized: Single point cane Level of assistance: Modified independence Comments: Patient demonstrated decreased stride length and gait speed with step-through gait pattern  TREATMENT:   12/17/23:      EXERCISE LOG  Exercise Repetitions and Resistance Comments  Recumbent bike Seat 3 x 15 minutes.   Lunges 14" box x 3 mins   Knee ext 10# x 3.5 minutes   Ham curls  40# x 3.5 minutes   Leg press 2 plates; seat 6 x 3.5 minutes        Modalities  Date:  Vaso: Knee, 34 degrees; low pressure, 12 mins, Pain   12/11/23:                                     EXERCISE LOG  Exercise Repetitions and  Resistance Comments  Recumbent bike Seat 3 x 15 minutes.   Knee ext 10# x 3 minutes   Ham curls  40# x 3 minutes   Leg press 2 plates x 3 minutes       STW/M x 14 minutes to patient's left distal quad/incisional site f/b Vasopneumatic on medium x 10 minutes.    12/06/23:                                       EXERCISE LOG  Exercise Repetitions and Resistance Comments  Recumbent bike  15 minutes   Knee ext 10# x 3 minutes   Ham curls 30# x 3 minutes           STW/M x 7 minutes f/b Vasopneumatic x 8 minutes.  PATIENT EDUCATION:  Education details: Patient educated on objective findings, POC, prognosis and goals for therapy.  Person educated: Patient Education method: Explanation Education comprehension: verbalized understanding  HOME EXERCISE PROGRAM:  ASSESSMENT:  CLINICAL IMPRESSION:  Pt arrives for today's treatment session denying any pain.  Pt reports going to local [employer] to workout when not at therapy.  Pt able to demonstrate 121 degrees of active left knee flexion today, meeting his LTG.  Pt also able to increase LEFS score to 73/80, well surpassing his goal.  Pt has met all goals set forth for him by physical therapy at this time.  Pt has appt with surgeon on Friday and will call the facility with his plans as far as continuation of therapy.  Pt encouraged to call the facility with any questions or concerns.  Normal responses to vaso noted upon removal.  Pt denied any pain at completion of today's treatment session.  OBJECTIVE IMPAIRMENTS: decreased activity tolerance, decreased endurance, decreased mobility, difficulty walking, decreased ROM, decreased strength, hypomobility, increased edema, impaired flexibility, and pain.   ACTIVITY LIMITATIONS: carrying, lifting, bending, sitting, standing, squatting, and stairs  PARTICIPATION LIMITATIONS: bike riding  PERSONAL FACTORS: 3+ comorbidities: Spina bifida, primary osteoarthritis hypertension,type 1 diabetes mellitus are also  affecting patient's functional outcome.   REHAB POTENTIAL: Good  CLINICAL DECISION MAKING: Stable/uncomplicated  EVALUATION COMPLEXITY: Low   GOALS: Goals reviewed with patient? No   LONG TERM GOALS: Target date: 12/21/23  The patient will be independent with their HEP.  Baseline: Goal status: MET  2.  Patient's LEFS score will increase from 46 to at least 58, which will enable greater confidence in performance ADL's.   Baseline: 5/12: 73/80 Goal status: MET  3.  Patient's pain at its worst will decrease from 8/10 to 6/10 which will enable him to walk for longer periods of time.   Baseline: 5/12: 2/10  Goal status: MET  4. Patient's knee flexion will increase to at least 120 degrees which will improve his ability to ascend and descend stairs.  Baseline: 5/12: 121 degrees  Goal status: MET  4. Patient's will be able to ambulate with no significant gait deviations.  Baseline: 5/12: not significant   Goal status: MET  PLAN:  PT FREQUENCY: 2x/week  PT DURATION: 4 weeks  PLANNED INTERVENTIONS: 97164- PT Re-evaluation, 97110-Therapeutic exercises, 97530- Therapeutic activity, 97112- Neuromuscular re-education, 97535- Self Care, 81191- Manual therapy, [phone]- Gait training, [phone]- Electrical stimulation (unattended), 97016- Vasopneumatic device, Patient/Family education, Stair training, Joint mobilization, Scar mobilization, Compression bandaging, Cryotherapy, and Moist heat  PLAN FOR NEXT SESSION: Nustep, progress knee AAROM, continue concentric hip, knee and lower leg strengthening exercises, joint mobilizations, modalities as needed.    Deryl Flora, PTA 12/17/2023, 2:52 PM

## 2023-12-21 ENCOUNTER — Encounter: Admitting: *Deleted
